# Patient Record
Sex: Male | Born: 1965 | Race: White | Hispanic: No | Marital: Married | Smoking: Current every day smoker
Health system: Southern US, Community
[De-identification: ages and names within clinical notes are randomized; demographics above are authoritative.]

## PROBLEM LIST (undated history)

## (undated) DIAGNOSIS — F1121 Opioid dependence, in remission: Secondary | ICD-10-CM

## (undated) DIAGNOSIS — F329 Major depressive disorder, single episode, unspecified: Secondary | ICD-10-CM

## (undated) DIAGNOSIS — F32A Depression, unspecified: Secondary | ICD-10-CM

## (undated) DIAGNOSIS — I10 Essential (primary) hypertension: Secondary | ICD-10-CM

## (undated) HISTORY — PX: HERNIA REPAIR: SHX51

## (undated) HISTORY — PX: OTHER SURGICAL HISTORY: SHX169

## (undated) HISTORY — PX: CHOLECYSTECTOMY: SHX55

---

## 2008-11-26 ENCOUNTER — Emergency Department (HOSPITAL_COMMUNITY): Admission: EM | Admit: 2008-11-26 | Discharge: 2008-11-26 | Payer: Self-pay | Admitting: Emergency Medicine

## 2009-03-10 ENCOUNTER — Encounter: Admission: RE | Admit: 2009-03-10 | Discharge: 2009-03-10 | Payer: Self-pay | Admitting: Internal Medicine

## 2009-05-11 ENCOUNTER — Emergency Department (HOSPITAL_COMMUNITY): Admission: EM | Admit: 2009-05-11 | Discharge: 2009-05-11 | Payer: Self-pay | Admitting: Emergency Medicine

## 2009-05-17 ENCOUNTER — Emergency Department (HOSPITAL_COMMUNITY): Admission: EM | Admit: 2009-05-17 | Discharge: 2009-05-17 | Payer: Self-pay | Admitting: Emergency Medicine

## 2009-07-25 ENCOUNTER — Ambulatory Visit (HOSPITAL_COMMUNITY): Admission: RE | Admit: 2009-07-25 | Discharge: 2009-07-25 | Payer: Self-pay | Admitting: Family Medicine

## 2009-12-03 ENCOUNTER — Inpatient Hospital Stay (HOSPITAL_COMMUNITY): Admission: EM | Admit: 2009-12-03 | Discharge: 2009-12-06 | Payer: Self-pay | Admitting: Emergency Medicine

## 2010-05-21 ENCOUNTER — Encounter: Admission: RE | Admit: 2010-05-21 | Discharge: 2010-05-21 | Payer: Self-pay | Admitting: Internal Medicine

## 2010-05-22 ENCOUNTER — Encounter: Admission: RE | Admit: 2010-05-22 | Discharge: 2010-05-22 | Payer: Self-pay | Admitting: Internal Medicine

## 2010-10-31 ENCOUNTER — Encounter: Payer: Self-pay | Admitting: Internal Medicine

## 2010-12-29 LAB — CBC
HCT: 44.3 % (ref 39.0–52.0)
HCT: 46.5 % (ref 39.0–52.0)
HCT: 46.8 % (ref 39.0–52.0)
Hemoglobin: 15.9 g/dL (ref 13.0–17.0)
Hemoglobin: 16.4 g/dL (ref 13.0–17.0)
MCHC: 35.1 g/dL (ref 30.0–36.0)
MCHC: 35.2 g/dL (ref 30.0–36.0)
MCV: 92.9 fL (ref 78.0–100.0)
Platelets: 141 10*3/uL — ABNORMAL LOW (ref 150–400)
Platelets: 88 10*3/uL — ABNORMAL LOW (ref 150–400)
RDW: 12.6 % (ref 11.5–15.5)
RDW: 13.2 % (ref 11.5–15.5)
RDW: 13.3 % (ref 11.5–15.5)
WBC: 12.1 10*3/uL — ABNORMAL HIGH (ref 4.0–10.5)
WBC: 5.1 10*3/uL (ref 4.0–10.5)

## 2010-12-29 LAB — URINE MICROSCOPIC-ADD ON

## 2010-12-29 LAB — URINALYSIS, ROUTINE W REFLEX MICROSCOPIC
Bilirubin Urine: NEGATIVE
Urobilinogen, UA: 0.2 mg/dL (ref 0.0–1.0)

## 2010-12-29 LAB — DIFFERENTIAL
Monocytes Absolute: 0.4 10*3/uL (ref 0.1–1.0)
Neutro Abs: 11.2 10*3/uL — ABNORMAL HIGH (ref 1.7–7.7)
Neutrophils Relative %: 92 % — ABNORMAL HIGH (ref 43–77)

## 2010-12-29 LAB — URINE CULTURE

## 2010-12-29 LAB — BASIC METABOLIC PANEL
CO2: 26 mEq/L (ref 19–32)
Chloride: 103 mEq/L (ref 96–112)
GFR calc non Af Amer: >60 mL/min (ref >60)
Potassium: 3.9 mEq/L (ref 3.5–5.1)

## 2010-12-29 LAB — CULTURE, BLOOD (ROUTINE X 2)

## 2011-01-16 LAB — URINALYSIS, ROUTINE W REFLEX MICROSCOPIC
Bilirubin Urine: NEGATIVE
Glucose, UA: NEGATIVE mg/dL
Ketones, ur: NEGATIVE mg/dL
Leukocytes, UA: NEGATIVE
Protein, ur: NEGATIVE mg/dL
Specific Gravity, Urine: <1.005 — ABNORMAL LOW (ref 1.005–1.030)
Urobilinogen, UA: 0.2 mg/dL (ref 0.0–1.0)
pH: 6 (ref 5.0–8.0)

## 2011-01-16 LAB — URINE MICROSCOPIC-ADD ON

## 2011-01-25 LAB — DIFFERENTIAL
Eosinophils Relative: 5 % (ref 0–5)
Lymphocytes Relative: 36 % (ref 12–46)
Monocytes Absolute: 0.6 10*3/uL (ref 0.1–1.0)
Monocytes Relative: 7 % (ref 3–12)
Neutrophils Relative %: 51 % (ref 43–77)

## 2011-01-25 LAB — POCT CARDIAC MARKERS
CKMB, poc: <1 ng/mL — ABNORMAL LOW (ref 1.0–8.0)
CKMB, poc: <1 ng/mL — ABNORMAL LOW (ref 1.0–8.0)
Myoglobin, poc: 80.2 ng/mL (ref 12–200)
Troponin i, poc: <0.05 ng/mL (ref 0.00–0.09)

## 2011-01-25 LAB — POCT I-STAT, CHEM 8
Calcium, Ion: 1.22 mmol/L (ref 1.12–1.32)
Creatinine, Ser: 0.9 mg/dL (ref 0.4–1.5)
Potassium: 4.9 mEq/L (ref 3.5–5.1)

## 2011-01-25 LAB — CBC
MCHC: 34.8 g/dL (ref 30.0–36.0)
Platelets: 223 10*3/uL (ref 150–400)

## 2015-02-09 ENCOUNTER — Other Ambulatory Visit (HOSPITAL_COMMUNITY): Payer: Self-pay

## 2015-02-09 ENCOUNTER — Observation Stay (HOSPITAL_COMMUNITY)
Admission: EM | Admit: 2015-02-09 | Discharge: 2015-02-10 | Disposition: A | Discharge status: Home / Self Care | Payer: BLUE CROSS/BLUE SHIELD | Attending: Emergency Medicine | Admitting: Emergency Medicine

## 2015-02-09 ENCOUNTER — Encounter (HOSPITAL_COMMUNITY): Payer: Self-pay | Admitting: *Deleted

## 2015-02-09 ENCOUNTER — Emergency Department (HOSPITAL_COMMUNITY): Payer: BLUE CROSS/BLUE SHIELD

## 2015-02-09 DIAGNOSIS — Z720 Tobacco use: Secondary | ICD-10-CM | POA: Insufficient documentation

## 2015-02-09 DIAGNOSIS — R0602 Shortness of breath: Secondary | ICD-10-CM | POA: Insufficient documentation

## 2015-02-09 DIAGNOSIS — R079 Chest pain, unspecified: Principal | ICD-10-CM | POA: Insufficient documentation

## 2015-02-09 DIAGNOSIS — I10 Essential (primary) hypertension: Secondary | ICD-10-CM | POA: Diagnosis not present

## 2015-02-09 DIAGNOSIS — R61 Generalized hyperhidrosis: Secondary | ICD-10-CM | POA: Diagnosis not present

## 2015-02-09 DIAGNOSIS — E871 Hypo-osmolality and hyponatremia: Secondary | ICD-10-CM | POA: Insufficient documentation

## 2015-02-09 DIAGNOSIS — F1121 Opioid dependence, in remission: Secondary | ICD-10-CM | POA: Diagnosis not present

## 2015-02-09 HISTORY — DX: Opioid dependence, in remission: F11.21

## 2015-02-09 LAB — CBC WITH DIFFERENTIAL/PLATELET
BASOS ABS: 0.1 10*3/uL (ref 0.0–0.1)
Basophils Relative: 1 % (ref 0–1)
EOS ABS: 0.2 10*3/uL (ref 0.0–0.7)
Eosinophils Relative: 3 % (ref 0–5)
HEMATOCRIT: 42.7 % (ref 39.0–52.0)
HEMOGLOBIN: 15.3 g/dL (ref 13.0–17.0)
LYMPHS PCT: 33 % (ref 12–46)
Lymphs Abs: 2.5 10*3/uL (ref 0.7–4.0)
MCH: 32.3 pg (ref 26.0–34.0)
MCHC: 35.8 g/dL (ref 30.0–36.0)
MCV: 90.3 fL (ref 78.0–100.0)
Monocytes Absolute: 0.6 10*3/uL (ref 0.1–1.0)
Monocytes Relative: 8 % (ref 3–12)
Neutro Abs: 4.2 10*3/uL (ref 1.7–7.7)
Neutrophils Relative %: 55 % (ref 43–77)
Platelets: 273 10*3/uL (ref 150–400)
RBC: 4.73 MIL/uL (ref 4.22–5.81)
RDW: 12.6 % (ref 11.5–15.5)
WBC: 7.6 10*3/uL (ref 4.0–10.5)

## 2015-02-09 LAB — BASIC METABOLIC PANEL
Anion gap: 9 (ref 5–15)
BUN: 14 mg/dL (ref 6–20)
CHLORIDE: 94 mmol/L — AB (ref 101–111)
CO2: 25 mmol/L (ref 22–32)
Calcium: 8.6 mg/dL — ABNORMAL LOW (ref 8.9–10.3)
Creatinine, Ser: 0.84 mg/dL (ref 0.61–1.24)
GFR calc Af Amer: >60 mL/min (ref >60)
Glucose, Bld: 100 mg/dL — ABNORMAL HIGH (ref 70–99)
POTASSIUM: 3.5 mmol/L (ref 3.5–5.1)
SODIUM: 128 mmol/L — AB (ref 135–145)

## 2015-02-09 LAB — TROPONIN I

## 2015-02-09 LAB — MAGNESIUM: MAGNESIUM: 2.1 mg/dL (ref 1.7–2.4)

## 2015-02-09 MED ORDER — ACETAMINOPHEN 325 MG PO TABS: 650.0000 mg | ORAL_TABLET | ORAL | Status: DC | PRN

## 2015-02-09 MED ORDER — HYDROCHLOROTHIAZIDE 25 MG PO TABS
25.0000 mg | ORAL_TABLET | Freq: Every day | ORAL | Status: DC
  Administered 2015-02-10: 25 mg via ORAL
  Filled 2015-02-09: qty 1

## 2015-02-09 MED ORDER — BUPRENORPHINE HCL 2 MG SL SUBL
8.0000 mg | SUBLINGUAL_TABLET | Freq: Three times a day (TID) | SUBLINGUAL | Status: DC
  Administered 2015-02-10: 8 mg via SUBLINGUAL
  Filled 2015-02-09 (×2): qty 4

## 2015-02-09 MED ORDER — GABAPENTIN 300 MG PO CAPS
300.0000 mg | ORAL_CAPSULE | Freq: Four times a day (QID) | ORAL | Status: DC
  Administered 2015-02-10 (×2): 300 mg via ORAL
  Filled 2015-02-09 (×3): qty 1

## 2015-02-09 MED ORDER — OLANZAPINE-FLUOXETINE HCL 6-25 MG PO CAPS
2.0000 | ORAL_CAPSULE | Freq: Every day | ORAL | Status: DC
  Administered 2015-02-09: 2 via ORAL
  Filled 2015-02-09 (×2): qty 2

## 2015-02-09 MED ORDER — ASPIRIN 81 MG PO CHEW
324.0000 mg | CHEWABLE_TABLET | Freq: Once | ORAL | Status: AC
  Administered 2015-02-09: 324 mg via ORAL
  Filled 2015-02-09: qty 4

## 2015-02-09 MED ORDER — MORPHINE SULFATE 4 MG/ML IJ SOLN
4.0000 mg | INTRAMUSCULAR | Status: DC | PRN
  Filled 2015-02-09: qty 1

## 2015-02-09 MED ORDER — LISINOPRIL 10 MG PO TABS
20.0000 mg | ORAL_TABLET | Freq: Every day | ORAL | Status: DC
  Administered 2015-02-10: 20 mg via ORAL
  Filled 2015-02-09 (×3): qty 2

## 2015-02-09 MED ORDER — LEVOFLOXACIN 500 MG PO TABS: 500.0000 mg | ORAL_TABLET | Freq: Every day | ORAL | Status: DC

## 2015-02-09 MED ORDER — ATORVASTATIN CALCIUM 40 MG PO TABS
40.0000 mg | ORAL_TABLET | Freq: Every day | ORAL | Status: DC
  Filled 2015-02-09: qty 1

## 2015-02-09 MED ORDER — ARMODAFINIL 250 MG PO TABS
250.0000 mg | ORAL_TABLET | Freq: Every day | ORAL | Status: DC
  Filled 2015-02-09 (×2): qty 1

## 2015-02-09 MED ORDER — HEPARIN SODIUM (PORCINE) 5000 UNIT/ML IJ SOLN
5000.0000 [IU] | Freq: Three times a day (TID) | INTRAMUSCULAR | Status: DC
  Administered 2015-02-09 – 2015-02-10 (×2): 5000 [IU] via SUBCUTANEOUS
  Filled 2015-02-09 (×2): qty 1

## 2015-02-09 MED ORDER — DEXTROAMPHETAMINE SULFATE 5 MG PO TABS: 5.0000 mg | ORAL_TABLET | Freq: Three times a day (TID) | ORAL | Status: DC

## 2015-02-09 MED ORDER — NITROGLYCERIN 0.4 MG SL SUBL
0.4000 mg | SUBLINGUAL_TABLET | SUBLINGUAL | Status: AC | PRN
  Administered 2015-02-09 (×3): 0.4 mg via SUBLINGUAL
  Filled 2015-02-09: qty 1

## 2015-02-09 MED ORDER — ONDANSETRON HCL 4 MG/2ML IJ SOLN: 4.0000 mg | Freq: Four times a day (QID) | INTRAMUSCULAR | Status: DC | PRN

## 2015-02-09 MED ORDER — HYDROMORPHONE HCL 1 MG/ML IJ SOLN
1.0000 mg | INTRAMUSCULAR | Status: DC | PRN
Start: 2015-02-09 — End: 2015-02-10

## 2015-02-09 MED ORDER — SODIUM CHLORIDE 0.9 % IV SOLN
INTRAVENOUS | Status: AC
  Administered 2015-02-09: 22:00:00 via INTRAVENOUS

## 2015-02-09 MED ORDER — ONDANSETRON HCL 4 MG/2ML IJ SOLN: 4.0000 mg | Freq: Three times a day (TID) | INTRAMUSCULAR | Status: DC | PRN

## 2015-02-09 MED ORDER — LISINOPRIL 10 MG PO TABS: 20.0000 mg | ORAL_TABLET | Freq: Every day | ORAL | Status: DC

## 2015-02-09 NOTE — H&P (Signed)
[facility] History and Physical  Wesley Giles WUJ:[id number] DOB: 08/14/1966 DOA: 02/09/2015  Referring physician: ER, Dr. Clarene Duke PCP: No primary care provider on file.   Chief Complaint: Chest pain  HPI: Wesley Giles is a 49 y.o. male  This is a 49 year old man, who has a history of narcotic addiction and is on Suboxone, presents with chest pain which started at noon today and has been intermittent. He describes it as lasting for over an hour at a time associated with sweating and dyspnea. It radiates to the left shoulder. The patient is a smoker. He has hypertension. He does not have a family history of early coronary artery disease. He is now being admitted for further investigation.   Review of Systems:  Apart from symptoms mentioned above, all systems negative.   Past Medical History  Diagnosis Date  . History of narcotic addiction 02/09/2015   Past Surgical History  Procedure Laterality Date  . Hernia repair    . Cholecystectomy    . Depression     Social History:  reports that he has been smoking.  He does not have any smokeless tobacco history on file. He reports that he does not drink alcohol or use illicit drugs.  Allergies  Allergen Reactions  . Sulfa Antibiotics Anaphylaxis     Family history: Father has diabetes.   Prior to Admission medications   Medication Sig Start Date End Date Taking? Authorizing Provider  aspirin-acetaminophen-caffeine (EXCEDRIN MIGRAINE) [phone] MG per tablet Take 1-2 tablets by mouth every 6 (six) hours as needed for headache or migraine.   Yes Historical Provider, MD  atorvastatin (LIPITOR) 40 MG tablet Take 40 mg by mouth daily. 01/29/15 01/29/16 Yes Historical Provider, MD  dextroamphetamine (DEXTROSTAT) 5 MG tablet Take 5 mg by mouth 3 (three) times daily. 12/29/14  Yes Historical Provider, MD  gabapentin (NEURONTIN) 300 MG capsule Take 300 mg by mouth 4 (four) times daily.   Yes Historical Provider, MD    hydrochlorothiazide (HYDRODIURIL) 25 MG tablet Take 25 mg by mouth daily. 01/26/15  Yes Historical Provider, MD  lisinopril (PRINIVIL,ZESTRIL) 20 MG tablet Take 20 mg by mouth daily. 01/01/15  Yes Historical Provider, MD  NUVIGIL 250 MG tablet Take 250 mg by mouth daily. 01/01/15  Yes Historical Provider, MD  OLANZapine-FLUoxetine (SYMBYAX) 6-25 MG per capsule Take 2 capsules by mouth at bedtime. 12/29/14  Yes Historical Provider, MD  SUBOXONE 8-2 MG FILM Place 1 Film under the tongue 3 (three) times daily. 01/29/15  Yes Historical Provider, MD  levofloxacin (LEVAQUIN) 500 MG tablet Take 500 mg by mouth daily. 10 dya course starting on 01/28/2015 01/28/15   Historical Provider, MD   Physical Exam: Filed Vitals:   02/09/15 1930 02/09/15 2000 02/09/15 2030 02/09/15 2100  BP: 110/83 126/95 106/82 110/66  Pulse: 78 73 70 74  Temp:      TempSrc:      Resp: Height:      Weight:      SpO2: 95% 99% 96% 95%    Wt Readings from Last 3 Encounters:  02/09/15 81.647 kg (180 lb)    General:  Appears calm and comfortable. He does not appear to be in pain at the present time. Eyes: PERRL, normal lids, irises & conjunctiva ENT: grossly normal hearing, lips & tongue Neck: no LAD, masses or thyromegaly Cardiovascular: RRR, no m/r/g. No LE edema. Telemetry: SR, no arrhythmias  Respiratory: CTA bilaterally, no w/r/r. Normal respiratory effort. Abdomen: soft, ntnd  Skin: no rash or induration seen on limited exam Musculoskeletal: grossly normal tone BUE/BLE Psychiatric: grossly normal mood and affect, speech fluent and appropriate Neurologic: grossly non-focal.          Labs on Admission:  Basic Metabolic Panel:  Recent Labs Lab 02/09/15 1930  NA 128*  K 3.5  CL 94*  CO2 25  GLUCOSE 100*  BUN 14  CREATININE 0.84  CALCIUM 8.6*  MG 2.1   Liver Function Tests: No results for input(s): AST, ALT, ALKPHOS, BILITOT, PROT, ALBUMIN in the last 168 hours. No results for input(s):  LIPASE, AMYLASE in the last 168 hours. No results for input(s): AMMONIA in the last 168 hours. CBC:  Recent Labs Lab 02/09/15 1930  WBC 7.6  NEUTROABS 4.2  HGB 15.3  HCT 42.7  MCV 90.3  PLT 273   Cardiac Enzymes:  Recent Labs Lab 02/09/15 1930  TROPONINI <0.03    BNP (last 3 results) No results for input(s): BNP in the last 8760 hours.  ProBNP (last 3 results) No results for input(s): PROBNP in the last 8760 hours.  CBG: No results for input(s): GLUCAP in the last 168 hours.  Radiological Exams on Admission: Dg Chest Portable 1 View  02/09/2015   CLINICAL DATA:  49 year old male with sharp central chest pain since noon today.  EXAM: PORTABLE CHEST - 1 VIEW  COMPARISON:  Chest x-ray 07/19/2014.  FINDINGS: Emphysematous changes are noted in the lungs bilaterally. No acute consolidative airspace disease. No pleural effusions. No evidence of pulmonary edema. No definite suspicious appearing pulmonary nodules or masses. Heart size is normal. Upper mediastinal contours are within normal limits.  IMPRESSION: 1. No radiographic evidence of acute cardiopulmonary disease. 2. Emphysema.   Electronically Signed   By: Trudie Reedaniel  Entrikin M.D.   On: 02/09/2015 19:36    EKG: Independently reviewed. Normal sinus rhythm without any acute ST-T wave changes.  Assessment/Plan   1. Chest pain. Although the description he gives for the chest pain is almost classical for cardiac pain, there are no electrocardiogram changes and troponin levels are negative. We will cycle troponins levels. We will request cardiology consultation. He may need a stress test. 2. Hypertension. Continue with antihypertensive medications. 3. History of narcotic addiction. Continue with Suboxone.  Further recommendations will depend on patient's hospital progress.   Code Status: Full code.  DVT Prophylaxis: Heparin.  Family Communication: I discussed the plan with the patient at the bedside.   Disposition Plan: Home  when medically stable.  Time spent: 45 minutes.  Wesley Giles [facility] Pager [phone].

## 2015-02-09 NOTE — ED Provider Notes (Signed)
CSN: [id number]     Arrival date & time 02/09/15  1846 History   First MD Initiated Contact with Patient 02/09/15 1854     Chief Complaint  Patient presents with  . Chest Pain      HPI Pt was seen at 1905. Per pt, c/o gradual onset and persistence of several intermittent episodes of chest "pain" since noon today. Pt describes the CP as "heaviness" and "tightness," located in his mid-sternal area with radiation into his left shoulder. Has been associated with SOB and diaphoresis. Pt's symptoms lasted for approximately 1 hour before resolving. Pt states his symptoms then "came back." Pt is unclear what makes his symptoms better or worse. Denies palpitations, no cough, no abd pain, no N/V/D, no back pain.    History reviewed. No pertinent past medical history.   Past Surgical History  Procedure Laterality Date  . Hernia repair    . Cholecystectomy    . Depression      History  Substance Use Topics  . Smoking status: Current Every Day Smoker  . Smokeless tobacco: Not on file  . Alcohol Use: No    Review of Systems ROS: Statement: All systems negative except as marked or noted in the HPI; Constitutional: Negative for fever and chills. ; ; Eyes: Negative for eye pain, redness and discharge. ; ; ENMT: Negative for ear pain, hoarseness, nasal congestion, sinus pressure and sore throat. ; ; Cardiovascular: +CP, SOB, diaphoresis. Negative for palpitations, and peripheral edema. ; ; Respiratory: Negative for cough, wheezing and stridor. ; ; Gastrointestinal: Negative for nausea, vomiting, diarrhea, abdominal pain, blood in stool, hematemesis, jaundice and rectal bleeding. . ; ; Genitourinary: Negative for dysuria, flank pain and hematuria. ; ; Musculoskeletal: Negative for back pain and neck pain. Negative for swelling and trauma.; ; Skin: Negative for pruritus, rash, abrasions, blisters, bruising and skin lesion.; ; Neuro: Negative for headache, lightheadedness and neck stiffness. Negative for  weakness, altered level of consciousness , altered mental status, extremity weakness, paresthesias, involuntary movement, seizure and syncope.      Allergies  Review of patient's allergies indicates not on file.  Home Medications   Prior to Admission medications   Not on File   BP 132/95 mmHg  Pulse 75  Temp(Src) 97.7 F (36.5 C) (Oral)  Resp 20  Ht  (1.88 m)  Wt 180 lb (81.647 kg)  BMI 23.10 kg/m2  SpO2 97% Physical Exam  1910: Physical examination:  Nursing notes reviewed; Vital signs and O2 SAT reviewed;  Constitutional: Well developed, Well nourished, Well hydrated, In no acute distress; Head:  Normocephalic, atraumatic; Eyes: EOMI, PERRL, No scleral icterus; ENMT: Mouth and pharynx normal, Mucous membranes moist; Neck: Supple, Full range of motion, No lymphadenopathy; Cardiovascular: Regular rate and rhythm, No gallop; Respiratory: Breath sounds clear & equal bilaterally, No wheezes.  Speaking full sentences with ease, Normal respiratory effort/excursion; Chest: Nontender, Movement normal; Abdomen: Soft, Nontender, Nondistended, Normal bowel sounds; Genitourinary: No CVA tenderness; Extremities: Pulses normal, No tenderness, No edema, No calf edema or asymmetry.; Neuro: AA&Ox3, Major CN grossly intact.  Speech clear. No gross focal motor or sensory deficits in extremities.; Skin: Color normal, Warm, Dry.   ED Course  Procedures     EKG Interpretation None      MDM  MDM Reviewed: previous chart, nursing note and vitals Reviewed previous: labs and ECG Interpretation: labs, ECG and x-ray Total time providing critical care: 30-74 minutes. This excludes time spent performing separately reportable procedures and services. Consults:  admitting MD   CRITICAL CARE Performed by: Laray AngerMCMANUS,Lizabeth Fellner M Total critical care time: 35 Critical care time was exclusive of separately billable procedures and treating other patients. Critical care was necessary to treat or prevent  imminent or life-threatening deterioration. Critical care was time spent personally by me on the following activities: development of treatment plan with patient and/or surrogate as well as nursing, discussions with consultants, evaluation of patient's response to treatment, examination of patient, obtaining history from patient or surrogate, ordering and performing treatments and interventions, ordering and review of laboratory studies, ordering and review of radiographic studies, pulse oximetry and re-evaluation of patient's condition.    ED ECG REPORT   Date: 02/09/2015  Rate: 78  Rhythm: normal sinus rhythm  QRS Axis: normal  Intervals: normal  ST/T Wave abnormalities: normal  Conduction Disutrbances:none  Narrative Interpretation:   Old EKG Reviewed: unchanged; no significant changes compared to previous EKG dated 11/26/2008.  I have personally reviewed the EKG tracing and agree with the computerized printout as noted.   Results for orders placed or performed during the hospital encounter of 02/09/15  CBC with Differential  Result Value Ref Range   WBC 7.6 4.0 - 10.5 K/uL   RBC 4.73 4.22 - 5.81 MIL/uL   Hemoglobin 15.3 13.0 - 17.0 g/dL   HCT 16.142.7 09.639.0 - 04.552.0 %   MCV 90.3 78.0 - 100.0 fL   MCH 32.3 26.0 - 34.0 pg   MCHC 35.8 30.0 - 36.0 g/dL   RDW 40.912.6 81.111.5 - 91.415.5 %   Platelets 273 150 - 400 K/uL   Neutrophils Relative % 55 43 - 77 %   Neutro Abs 4.2 1.7 - 7.7 K/uL   Lymphocytes Relative 33 12 - 46 %   Lymphs Abs 2.5 0.7 - 4.0 K/uL   Monocytes Relative 8 3 - 12 %   Monocytes Absolute 0.6 0.1 - 1.0 K/uL   Eosinophils Relative 3 0 - 5 %   Eosinophils Absolute 0.2 0.0 - 0.7 K/uL   Basophils Relative 1 0 - 1 %   Basophils Absolute 0.1 0.0 - 0.1 K/uL  Troponin I  Result Value Ref Range   Troponin I <0.03 <0.031 ng/mL  Basic metabolic panel  Result Value Ref Range   Sodium 128 (L) 135 - 145 mmol/L   Potassium 3.5 3.5 - 5.1 mmol/L   Chloride 94 (L) 101 - 111 mmol/L   CO2  25 22 - 32 mmol/L   Glucose, Bld 100 (H) 70 - 99 mg/dL   BUN 14 6 - 20 mg/dL   Creatinine, Ser 7.820.84 0.61 - 1.24 mg/dL   Calcium 8.6 (L) 8.9 - 10.3 mg/dL   GFR calc non Af Amer >60 >60 mL/min   GFR calc Af Amer >60 >60 mL/min   Anion gap 9 5 - 15   Dg Chest Portable 1 View 02/09/2015   CLINICAL DATA:  49 year old male with sharp central chest pain since noon today.  EXAM: PORTABLE CHEST - 1 VIEW  COMPARISON:  Chest x-ray 07/19/2014.  FINDINGS: Emphysematous changes are noted in the lungs bilaterally. No acute consolidative airspace disease. No pleural effusions. No evidence of pulmonary edema. No definite suspicious appearing pulmonary nodules or masses. Heart size is normal. Upper mediastinal contours are within normal limits.  IMPRESSION: 1. No radiographic evidence of acute cardiopulmonary disease. 2. Emphysema.   Electronically Signed   By: Trudie Reedaniel  Entrikin M.D.   On: 02/09/2015 19:36    2030:  New hyponatremia on today's labs; will  continue IVF. Pt's CP improving with ASA, SL ntg and IV morphine. Troponin normal and EKG unchanged from previous. Dx and testing d/w pt.  Questions answered.  Verb understanding, agreeable to admit.  T/C to [facility]. Karilyn Cota, case discussed, including:  HPI, pertinent PM/SHx, VS/PE, dx testing, ED course and treatment:  Agreeable to admit, requests to write temporary orders, obtain tele bed to team [facility].   Samuel Jester, DO 02/12/15 [phone]

## 2015-02-09 NOTE — ED Notes (Signed)
Chest pain with  Diaphoresis, onset today,

## 2015-02-10 DIAGNOSIS — R079 Chest pain, unspecified: Secondary | ICD-10-CM | POA: Diagnosis not present

## 2015-02-10 DIAGNOSIS — E871 Hypo-osmolality and hyponatremia: Secondary | ICD-10-CM | POA: Diagnosis present

## 2015-02-10 DIAGNOSIS — I10 Essential (primary) hypertension: Secondary | ICD-10-CM | POA: Diagnosis not present

## 2015-02-10 LAB — BASIC METABOLIC PANEL
Anion gap: 4 — ABNORMAL LOW (ref 5–15)
BUN: 12 mg/dL (ref 6–20)
CHLORIDE: 96 mmol/L — AB (ref 101–111)
CO2: 28 mmol/L (ref 22–32)
Calcium: 8.6 mg/dL — ABNORMAL LOW (ref 8.9–10.3)
Creatinine, Ser: 0.79 mg/dL (ref 0.61–1.24)
GFR calc Af Amer: >60 mL/min (ref >60)
Glucose, Bld: 88 mg/dL (ref 70–99)
POTASSIUM: 4.5 mmol/L (ref 3.5–5.1)
SODIUM: 128 mmol/L — AB (ref 135–145)

## 2015-02-10 LAB — TROPONIN I
Troponin I: <0.03 ng/mL (ref <0.031)
Troponin I: <0.03 ng/mL (ref <0.031)

## 2015-02-10 MED ORDER — HYDROCHLOROTHIAZIDE 25 MG PO TABS: 12.5000 mg | ORAL_TABLET | Freq: Every day | ORAL | Status: DC

## 2015-02-10 NOTE — Care Management Note (Signed)
Case Management Note  Patient Details  Name: Alain MarionMario P Reifsteck MRN: [id number] Date of Birth: June 07, 1966  Subjective/Objective:                  Pt admitted from home with CP. Pt lives with his wife and will return home at discharge. Pt is independent with ADL's.  Action/Plan: Pt for discharge today. No CM needs noted.  Expected Discharge Date:  02/10/15               Expected Discharge Plan:  Home/Self Care  In-House Referral:  NA  Discharge planning Services  CM Consult  Post Acute Care Choice:  NA Choice offered to:  NA  DME Arranged:    DME Agency:     HH Arranged:    HH Agency:     Status of Service:     Medicare Important Message Given:    Date Medicare IM Given:    Medicare IM give by:    Date Additional Medicare IM Given:    Additional Medicare Important Message give by:     If discussed at Long Length of Stay Meetings, dates discussed:    Additional Comments:  Cheryl FlashBlackwell, Jakoby Melendrez Crowder, RN 02/10/2015, 1:23 PM

## 2015-02-10 NOTE — Progress Notes (Signed)
Patient with orders to be discharge home. Discharge instructions given, patient verbalized understanding. Patient stable. Patient left in private vehicle.  

## 2015-02-10 NOTE — Discharge Summary (Signed)
Physician Discharge Summary  Wesley Giles MWU:[id number] DOB: 1966/02/10 DOA: 02/09/2015  PCP: No primary care provider on file.  Admit date: 02/09/2015 Discharge date: 02/10/2015  Time spent: 30 minutes  Recommendations for Outpatient Follow-up:  1.    Discharge Diagnoses:    Chest pain   HTN (hypertension)   History of narcotic addiction   Hyponatremia      Discharge Condition: improved  Diet recommendation: heart healthy  Filed Weights   02/09/15 1853  Weight: 81.647 kg (180 lb)    History of present illness:  Wesley Giles is a 49 y.o. male with a history of narcotic addiction and is on Suboxone and HTN who presented with chest pain. He described it as lasting for over an hour at a time associated with sweating and dyspnea The pain radiated to the left should. He does not have a family history of early coronary artery disease. He was admitted for further investigation and management.  Hospital Course:  On admission, the patient was afebrile and hemodynamically stable. His CXR revealed emphysema, but no acute findings. His EKG revealed NSR without any acute abnormalities. His lab data was only remarkable of a serum sodium of 128. He was started on ASA and his chronic medication were restarted. Morphine and sublingual nitro was ordered for pain. Gentle normal saline was given for hyponatremia. He was advised to stop smoking. Studies were ordered. His troponin I was negative x 4. His follow up EKG revealed NSR without any abnormalities. The patient reported no shortness of breath and no recent bilateral or unilateral leg swelling. He was oxygenating 95-97% on room air during the hospitalization. He reported having a negative stress test last year, performed by his cardiologist in [facility].  The patient had no complaints of chest pain during the hospital course. Etiology of his chest pain was unclear. He takes dextroamphetamine chronically which can cause chest pain and  palpitations, though it was unkown if it were the cause.Marland Kitchen He was informed of this and was advised to talk to his physician about the management of it. His serum sodium remained 128 after IV fluid hydration. The etiology was likely from HCTZ, so he was instructed to decrease the dose to 12.5 mg daily.  Procedures:  none  Consultations:  none  Discharge Exam: Filed Vitals:   02/10/15 0636  BP: 114/76  Pulse: 60  Temp: 97.8 F (36.6 C)  Resp: 20  02 sat 97 % room air  General: 49 yo man in NAD Cardiovascular: S1 S2 no M/R/G Respiratory: CTA bilaterally   Discharge Instructions   Discharge Instructions    Diet general    Complete by:  As directed      Discharge instructions    Complete by:  As directed   The dose of hydrochlorothiazide was decreased to half as it can cause a decrease in your blood sodium level. Your blood sodium level was still low at 128. Recommend that you follow-up with your primary care physician. Also of note, dextroamphetamine can cause chest pain and palpitations. This will need to be discussed further with your primary physicians.     Increase activity slowly    Complete by:  As directed           Current Discharge Medication List    CONTINUE these medications which have CHANGED   Details  hydrochlorothiazide (HYDRODIURIL) 25 MG tablet Take 0.5 tablets (12.5 mg total) by mouth daily.      CONTINUE these medications which have  NOT CHANGED   Details  aspirin-acetaminophen-caffeine (EXCEDRIN MIGRAINE) 250-250-65 MG per tablet Take 1-2 tablets by mouth every 6 (six) hours as needed for headache or migraine.    atorvastatin (LIPITOR) 40 MG tablet Take 40 mg by mouth daily.    dextroamphetamine (DEXTROSTAT) 5 MG tablet Take 5 mg by mouth 3 (three) times daily. Refills: 0    gabapentin (NEURONTIN) 300 MG capsule Take 300 mg by mouth 4 (four) times daily.    lisinopril (PRINIVIL,ZESTRIL) 20 MG tablet Take 20 mg by mouth daily. Refills: 5     NUVIGIL 250 MG tablet Take 250 mg by mouth daily. Refills: 3    OLANZapine-FLUoxetine (SYMBYAX) 6-25 MG per capsule Take 2 capsules by mouth at bedtime. Refills: 5    SUBOXONE 8-2 MG FILM Place 1 Film under the tongue 3 (three) times daily. Refills: 0      STOP taking these medications     levofloxacin (LEVAQUIN) 500 MG tablet        Allergies  Allergen Reactions  . Sulfa Antibiotics Anaphylaxis   Follow-up Information    Please follow up.   Why:  [place] your cardiologist and [facility] [place] [facility] in 1-2 weeks.       The results of significant diagnostics from this hospitalization (including imaging, microbiology, ancillary and laboratory) are listed below for reference.    Significant Diagnostic Studies: Dg Chest Portable 1 View  02/09/2015   CLINICAL DATA:  49 year old male with sharp central chest pain since noon today.  EXAM: PORTABLE CHEST - 1 VIEW  COMPARISON:  Chest x-ray 07/19/2014.  FINDINGS: Emphysematous changes are noted in the lungs bilaterally. No acute consolidative airspace disease. No pleural effusions. No evidence of pulmonary edema. No definite suspicious appearing pulmonary nodules or masses. Heart size is normal. Upper mediastinal contours are within normal limits.  IMPRESSION: 1. No radiographic evidence of acute cardiopulmonary disease. 2. Emphysema.   Electronically Signed   By: Trudie Reedaniel  Entrikin M.D.   On: 02/09/2015 19:36    Microbiology: No results found for this or any previous visit (from the past 240 hour(s)).   Labs: Basic Metabolic Panel:  Recent Labs Lab 02/09/15 1930 02/10/15 1011  NA 128* 128*  K 3.5 4.5  CL 94* 96*  CO2 25 28  GLUCOSE 100* 88  BUN 14 12  CREATININE 0.84 0.79  CALCIUM 8.6* 8.6*  MG 2.1  --    Liver Function Tests: No results for input(s): AST, ALT, ALKPHOS, BILITOT, PROT, ALBUMIN in the last 168 hours. No results for input(s): LIPASE, AMYLASE in the last 168 hours. No results for  input(s): AMMONIA in the last 168 hours. CBC:  Recent Labs Lab 02/09/15 1930  WBC 7.6  NEUTROABS 4.2  HGB 15.3  HCT 42.7  MCV 90.3  PLT 273   Cardiac Enzymes:  Recent Labs Lab 02/09/15 1930 02/09/15 2213 02/10/15 0039 02/10/15 0425  TROPONINI <0.03 <0.03 <0.03 <0.03   BNP: BNP (last 3 results) No results for input(s): BNP in the last 8760 hours.  ProBNP (last 3 results) No results for input(s): PROBNP in the last 8760 hours.  CBG: No results for input(s): GLUCAP in the last 168 hours.     Signed:  Madlyn Crosby  [facility] 02/10/2015, 12:52 PM

## 2015-10-03 IMAGING — CR DG CHEST 1V PORT
1 series · 1 of 1 positions shown · non-contrast
Comparison: Chest x-ray 07/19/2014.

CLINICAL DATA: 48-year-old male with sharp central chest pain since
noon today.

EXAM:
PORTABLE CHEST - 1 VIEW

[ap portable]
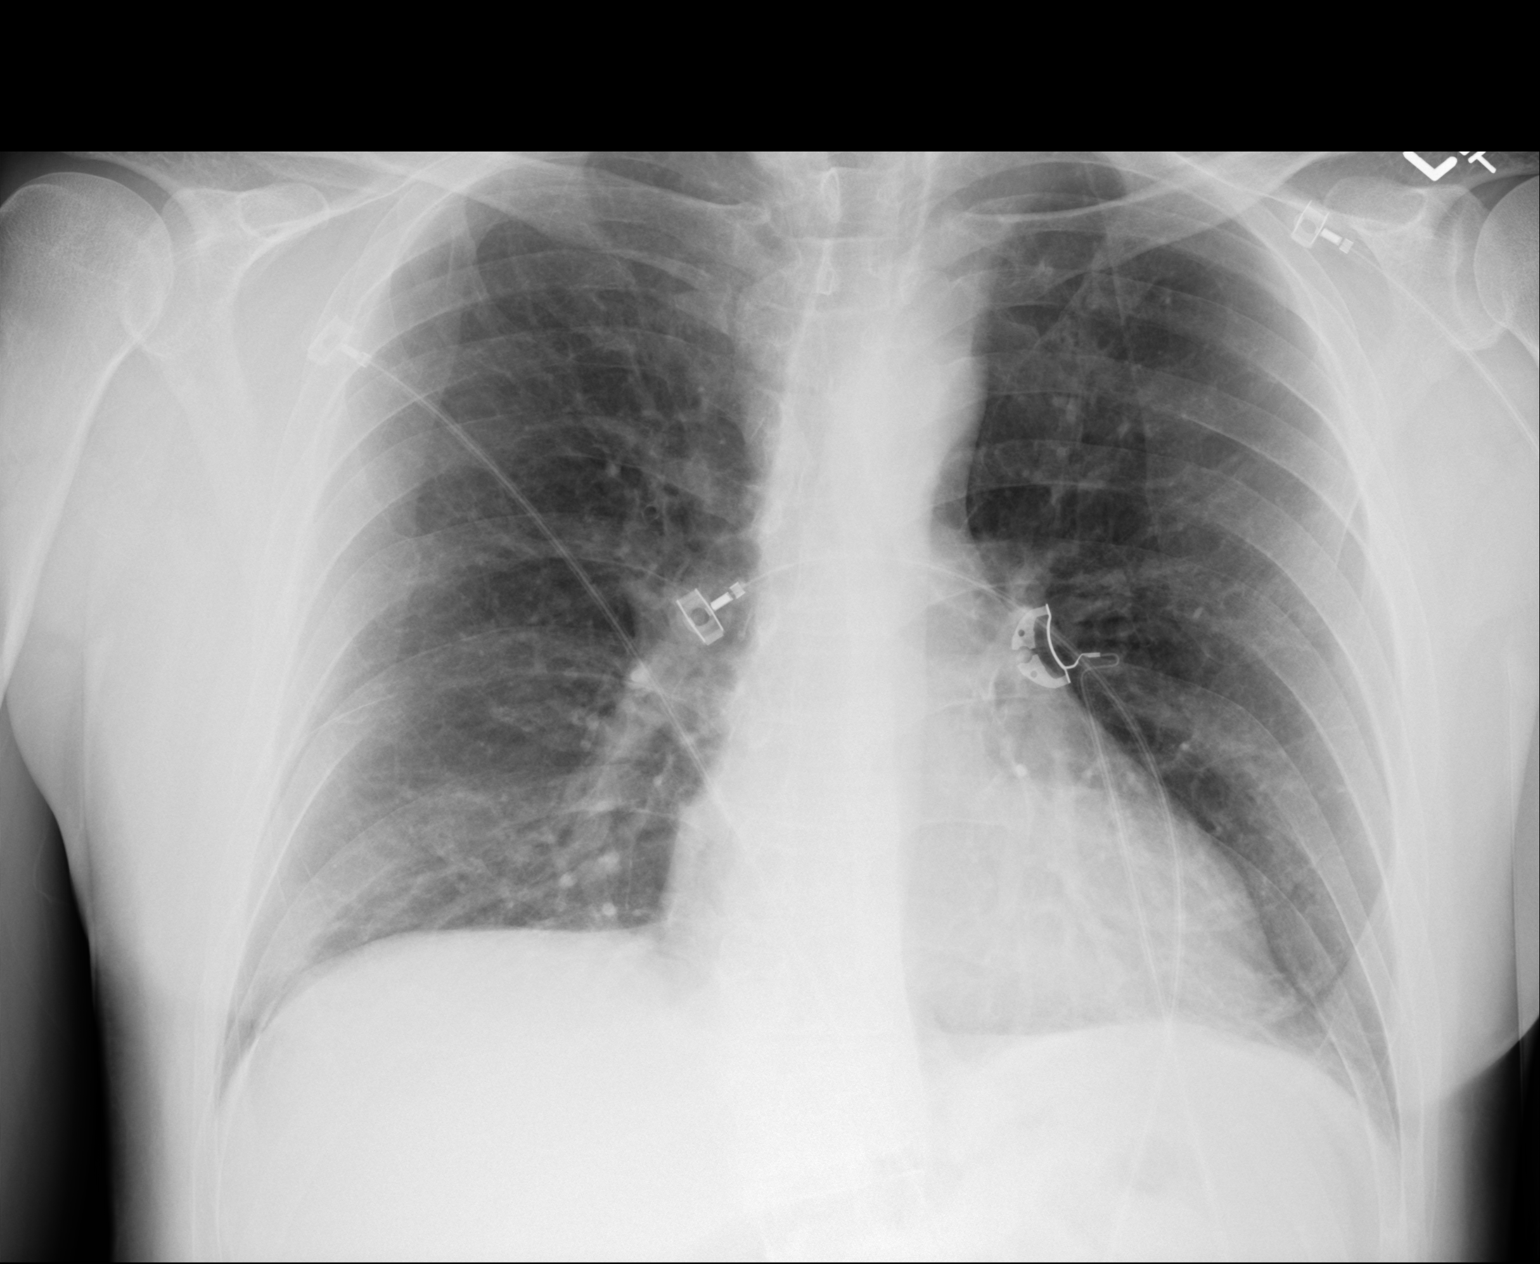

[1 of 1 positions shown; findings below may reference images not displayed]

FINDINGS: Emphysematous changes are noted in the lungs bilaterally. No acute
consolidative airspace disease. No pleural effusions. No evidence of
pulmonary edema. No definite suspicious appearing pulmonary nodules
or masses. Heart size is normal. Upper mediastinal contours are
within normal limits.
IMPRESSION: 1. No radiographic evidence of acute cardiopulmonary disease.
2. Emphysema.

## 2016-04-15 ENCOUNTER — Encounter (HOSPITAL_COMMUNITY): Payer: Self-pay

## 2016-04-15 ENCOUNTER — Encounter (HOSPITAL_COMMUNITY): Payer: Self-pay | Admitting: Emergency Medicine

## 2016-04-15 ENCOUNTER — Inpatient Hospital Stay (HOSPITAL_COMMUNITY)
Admission: AD | Admit: 2016-04-15 | Discharge: 2016-04-21 | DRG: 885 | Disposition: A | Discharge status: Home / Self Care | Payer: BLUE CROSS/BLUE SHIELD | Source: Intra-hospital | Attending: Psychiatry | Admitting: Psychiatry

## 2016-04-15 ENCOUNTER — Emergency Department (HOSPITAL_COMMUNITY)
Admission: EM | Admit: 2016-04-15 | Discharge: 2016-04-15 | Disposition: A | Discharge status: Other Acute Inpatient Hospital | Payer: BLUE CROSS/BLUE SHIELD | Attending: Emergency Medicine | Admitting: Emergency Medicine

## 2016-04-15 DIAGNOSIS — M549 Dorsalgia, unspecified: Secondary | ICD-10-CM | POA: Diagnosis present

## 2016-04-15 DIAGNOSIS — F332 Major depressive disorder, recurrent severe without psychotic features: Principal | ICD-10-CM | POA: Diagnosis present

## 2016-04-15 DIAGNOSIS — R45851 Suicidal ideations: Secondary | ICD-10-CM | POA: Diagnosis present

## 2016-04-15 DIAGNOSIS — G8929 Other chronic pain: Secondary | ICD-10-CM | POA: Diagnosis present

## 2016-04-15 DIAGNOSIS — F329 Major depressive disorder, single episode, unspecified: Secondary | ICD-10-CM | POA: Insufficient documentation

## 2016-04-15 DIAGNOSIS — K219 Gastro-esophageal reflux disease without esophagitis: Secondary | ICD-10-CM | POA: Diagnosis present

## 2016-04-15 DIAGNOSIS — G4700 Insomnia, unspecified: Secondary | ICD-10-CM | POA: Diagnosis present

## 2016-04-15 DIAGNOSIS — F411 Generalized anxiety disorder: Secondary | ICD-10-CM | POA: Diagnosis present

## 2016-04-15 DIAGNOSIS — J329 Chronic sinusitis, unspecified: Secondary | ICD-10-CM | POA: Diagnosis present

## 2016-04-15 DIAGNOSIS — I10 Essential (primary) hypertension: Secondary | ICD-10-CM | POA: Insufficient documentation

## 2016-04-15 DIAGNOSIS — Z7982 Long term (current) use of aspirin: Secondary | ICD-10-CM | POA: Insufficient documentation

## 2016-04-15 DIAGNOSIS — Z79899 Other long term (current) drug therapy: Secondary | ICD-10-CM | POA: Insufficient documentation

## 2016-04-15 DIAGNOSIS — T50902A Poisoning by unspecified drugs, medicaments and biological substances, intentional self-harm, initial encounter: Secondary | ICD-10-CM

## 2016-04-15 DIAGNOSIS — F1123 Opioid dependence with withdrawal: Secondary | ICD-10-CM | POA: Diagnosis present

## 2016-04-15 DIAGNOSIS — T50992A Poisoning by other drugs, medicaments and biological substances, intentional self-harm, initial encounter: Secondary | ICD-10-CM | POA: Insufficient documentation

## 2016-04-15 DIAGNOSIS — F17210 Nicotine dependence, cigarettes, uncomplicated: Secondary | ICD-10-CM | POA: Diagnosis present

## 2016-04-15 HISTORY — DX: Major depressive disorder, single episode, unspecified: F32.9

## 2016-04-15 HISTORY — DX: Depression, unspecified: F32.A

## 2016-04-15 HISTORY — DX: Essential (primary) hypertension: I10

## 2016-04-15 LAB — CBC WITH DIFFERENTIAL/PLATELET
BASOS ABS: 0.1 10*3/uL (ref 0.0–0.1)
BASOS PCT: 1 %
EOS ABS: 0.4 10*3/uL (ref 0.0–0.7)
Eosinophils Relative: 4 %
HEMATOCRIT: 48.6 % (ref 39.0–52.0)
HEMOGLOBIN: 17.3 g/dL — AB (ref 13.0–17.0)
Lymphocytes Relative: 23 %
Lymphs Abs: 2.4 10*3/uL (ref 0.7–4.0)
MCH: 32.2 pg (ref 26.0–34.0)
MCHC: 35.6 g/dL (ref 30.0–36.0)
MCV: 90.5 fL (ref 78.0–100.0)
MONOS PCT: 10 %
Monocytes Absolute: 1.1 10*3/uL — ABNORMAL HIGH (ref 0.1–1.0)
NEUTROS ABS: 6.5 10*3/uL (ref 1.7–7.7)
NEUTROS PCT: 62 %
Platelets: 347 10*3/uL (ref 150–400)
RBC: 5.37 MIL/uL (ref 4.22–5.81)
RDW: 13.3 % (ref 11.5–15.5)
WBC: 10.4 10*3/uL (ref 4.0–10.5)

## 2016-04-15 LAB — COMPREHENSIVE METABOLIC PANEL
ALT: 45 U/L (ref 17–63)
AST: 27 U/L (ref 15–41)
Albumin: 4.5 g/dL (ref 3.5–5.0)
Alkaline Phosphatase: 129 U/L — ABNORMAL HIGH (ref 38–126)
Anion gap: 8 (ref 5–15)
BUN: 18 mg/dL (ref 6–20)
CHLORIDE: 110 mmol/L (ref 101–111)
CO2: 20 mmol/L — AB (ref 22–32)
CREATININE: 1.02 mg/dL (ref 0.61–1.24)
Calcium: 9.4 mg/dL (ref 8.9–10.3)
Glucose, Bld: 106 mg/dL — ABNORMAL HIGH (ref 65–99)
POTASSIUM: 3.3 mmol/L — AB (ref 3.5–5.1)
SODIUM: 138 mmol/L (ref 135–145)
Total Bilirubin: 0.5 mg/dL (ref 0.3–1.2)
Total Protein: 8.1 g/dL (ref 6.5–8.1)

## 2016-04-15 LAB — SALICYLATE LEVEL

## 2016-04-15 LAB — RAPID URINE DRUG SCREEN, HOSP PERFORMED
AMPHETAMINES: NOT DETECTED
BARBITURATES: NOT DETECTED
BENZODIAZEPINES: NOT DETECTED
COCAINE: NOT DETECTED
OPIATES: NOT DETECTED
TETRAHYDROCANNABINOL: POSITIVE — AB

## 2016-04-15 LAB — ACETAMINOPHEN LEVEL

## 2016-04-15 LAB — ETHANOL

## 2016-04-15 MED ORDER — ACETAMINOPHEN 325 MG PO TABS
650.0000 mg | ORAL_TABLET | ORAL | Status: DC | PRN
  Administered 2016-04-15: 650 mg via ORAL
  Filled 2016-04-15: qty 2

## 2016-04-15 MED ORDER — MAGNESIUM HYDROXIDE 400 MG/5ML PO SUSP: 30.0000 mL | Freq: Every day | ORAL | Status: DC | PRN

## 2016-04-15 MED ORDER — ONDANSETRON 4 MG PO TBDP: 4.0000 mg | ORAL_TABLET | Freq: Four times a day (QID) | ORAL | Status: AC | PRN

## 2016-04-15 MED ORDER — NICOTINE 14 MG/24HR TD PT24: 14.0000 mg | MEDICATED_PATCH | Freq: Every day | TRANSDERMAL | Status: DC

## 2016-04-15 MED ORDER — METHOCARBAMOL 500 MG PO TABS
500.0000 mg | ORAL_TABLET | Freq: Three times a day (TID) | ORAL | Status: AC | PRN
  Administered 2016-04-16 – 2016-04-20 (×8): 500 mg via ORAL
  Filled 2016-04-15 (×8): qty 1

## 2016-04-15 MED ORDER — AMPHETAMINE-DEXTROAMPHETAMINE 10 MG PO TABS: 5.0000 mg | ORAL_TABLET | Freq: Three times a day (TID) | ORAL | Status: DC

## 2016-04-15 MED ORDER — LOPERAMIDE HCL 2 MG PO CAPS
2.0000 mg | ORAL_CAPSULE | ORAL | Status: AC | PRN
  Administered 2016-04-16: 4 mg via ORAL
  Administered 2016-04-16 – 2016-04-17 (×3): 2 mg via ORAL
  Filled 2016-04-15: qty 1
  Filled 2016-04-15: qty 2
  Filled 2016-04-15 (×2): qty 1

## 2016-04-15 MED ORDER — ZOLPIDEM TARTRATE 5 MG PO TABS: 5.0000 mg | ORAL_TABLET | Freq: Every evening | ORAL | Status: DC | PRN

## 2016-04-15 MED ORDER — HYDROCHLOROTHIAZIDE 25 MG PO TABS
12.5000 mg | ORAL_TABLET | Freq: Every day | ORAL | Status: DC
  Filled 2016-04-15 (×2): qty 0.5

## 2016-04-15 MED ORDER — LORAZEPAM 1 MG PO TABS: 1.0000 mg | ORAL_TABLET | Freq: Three times a day (TID) | ORAL | Status: DC | PRN

## 2016-04-15 MED ORDER — OLANZAPINE-FLUOXETINE HCL 6-25 MG PO CAPS
2.0000 | ORAL_CAPSULE | Freq: Every day | ORAL | Status: DC
  Filled 2016-04-15 (×3): qty 2

## 2016-04-15 MED ORDER — OLANZAPINE-FLUOXETINE HCL 6-25 MG PO CAPS
2.0000 | ORAL_CAPSULE | Freq: Every day | ORAL | Status: DC
  Filled 2016-04-15 (×2): qty 2

## 2016-04-15 MED ORDER — HYDROXYZINE HCL 25 MG PO TABS
25.0000 mg | ORAL_TABLET | Freq: Four times a day (QID) | ORAL | Status: DC | PRN
  Administered 2016-04-16 – 2016-04-18 (×6): 25 mg via ORAL
  Filled 2016-04-15 (×6): qty 1

## 2016-04-15 MED ORDER — DEXTROAMPHETAMINE SULFATE 5 MG PO TABS: 5.0000 mg | ORAL_TABLET | Freq: Three times a day (TID) | ORAL | Status: DC

## 2016-04-15 MED ORDER — CLONIDINE HCL 0.1 MG PO TABS
0.1000 mg | ORAL_TABLET | Freq: Every day | ORAL | Status: DC
  Filled 2016-04-15 (×2): qty 1

## 2016-04-15 MED ORDER — CLONIDINE HCL 0.1 MG PO TABS
0.1000 mg | ORAL_TABLET | ORAL | Status: AC
Start: 2016-04-18 — End: 2016-04-20
  Administered 2016-04-18 – 2016-04-20 (×4): 0.1 mg via ORAL
  Filled 2016-04-15 (×4): qty 1

## 2016-04-15 MED ORDER — CLONIDINE HCL 0.1 MG PO TABS
0.1000 mg | ORAL_TABLET | Freq: Four times a day (QID) | ORAL | Status: AC
  Administered 2016-04-16 – 2016-04-18 (×9): 0.1 mg via ORAL
  Filled 2016-04-15 (×11): qty 1

## 2016-04-15 MED ORDER — LISINOPRIL 10 MG PO TABS
20.0000 mg | ORAL_TABLET | Freq: Every day | ORAL | Status: DC
  Administered 2016-04-15: 20 mg via ORAL
  Filled 2016-04-15: qty 2

## 2016-04-15 MED ORDER — POTASSIUM CHLORIDE CRYS ER 20 MEQ PO TBCR
40.0000 meq | EXTENDED_RELEASE_TABLET | Freq: Once | ORAL | Status: AC
  Administered 2016-04-15: 40 meq via ORAL
  Filled 2016-04-15: qty 2

## 2016-04-15 MED ORDER — ALUM & MAG HYDROXIDE-SIMETH 200-200-20 MG/5ML PO SUSP
30.0000 mL | ORAL | Status: DC | PRN
  Administered 2016-04-16: 30 mL via ORAL
  Filled 2016-04-15: qty 30

## 2016-04-15 MED ORDER — LISINOPRIL 20 MG PO TABS
20.0000 mg | ORAL_TABLET | Freq: Every day | ORAL | Status: DC
  Administered 2016-04-16 – 2016-04-21 (×6): 20 mg via ORAL
  Filled 2016-04-15 (×9): qty 1

## 2016-04-15 MED ORDER — IBUPROFEN 400 MG PO TABS: 600.0000 mg | ORAL_TABLET | Freq: Three times a day (TID) | ORAL | Status: DC | PRN

## 2016-04-15 MED ORDER — DICYCLOMINE HCL 20 MG PO TABS
20.0000 mg | ORAL_TABLET | Freq: Four times a day (QID) | ORAL | Status: AC | PRN
  Administered 2016-04-16 – 2016-04-17 (×3): 20 mg via ORAL
  Filled 2016-04-15 (×3): qty 1

## 2016-04-15 MED ORDER — BUPRENORPHINE HCL 2 MG SL SUBL
2.0000 mg | SUBLINGUAL_TABLET | Freq: Every day | SUBLINGUAL | Status: DC
  Administered 2016-04-15: 2 mg via SUBLINGUAL
  Filled 2016-04-15: qty 1

## 2016-04-15 MED ORDER — NICOTINE 21 MG/24HR TD PT24
21.0000 mg | MEDICATED_PATCH | Freq: Every day | TRANSDERMAL | Status: DC
  Administered 2016-04-16 – 2016-04-21 (×6): 21 mg via TRANSDERMAL
  Filled 2016-04-15 (×8): qty 1

## 2016-04-15 MED ORDER — ONDANSETRON HCL 4 MG PO TABS: 4.0000 mg | ORAL_TABLET | Freq: Three times a day (TID) | ORAL | Status: DC | PRN

## 2016-04-15 MED ORDER — GABAPENTIN 300 MG PO CAPS
300.0000 mg | ORAL_CAPSULE | Freq: Four times a day (QID) | ORAL | Status: DC
  Administered 2016-04-15 (×2): 300 mg via ORAL
  Filled 2016-04-15 (×2): qty 1

## 2016-04-15 MED ORDER — NAPROXEN 500 MG PO TABS
500.0000 mg | ORAL_TABLET | Freq: Two times a day (BID) | ORAL | Status: AC | PRN
  Administered 2016-04-16 – 2016-04-20 (×9): 500 mg via ORAL
  Filled 2016-04-15 (×9): qty 1

## 2016-04-15 MED ORDER — ALUM & MAG HYDROXIDE-SIMETH 200-200-20 MG/5ML PO SUSP: 30.0000 mL | ORAL | Status: DC | PRN

## 2016-04-15 MED ORDER — ACETAMINOPHEN 325 MG PO TABS
650.0000 mg | ORAL_TABLET | Freq: Four times a day (QID) | ORAL | Status: DC | PRN
  Administered 2016-04-16 – 2016-04-21 (×4): 650 mg via ORAL
  Filled 2016-04-15 (×4): qty 2

## 2016-04-15 MED ORDER — PNEUMOCOCCAL VAC POLYVALENT 25 MCG/0.5ML IJ INJ
0.5000 mL | INJECTION | INTRAMUSCULAR | Status: AC
  Administered 2016-04-16: 0.5 mL via INTRAMUSCULAR

## 2016-04-15 NOTE — ED Notes (Signed)
Pt reports SI, states he took unknown amount of amlodipine and lisinopril yesterday at 1200 to OD. States he slept for most of the day after, did not seek medical care. Denies any ingestion today. Denies HI.

## 2016-04-15 NOTE — ED Notes (Signed)
Pt ambulatory to restroom at this time. 

## 2016-04-15 NOTE — ED Notes (Signed)
[facility], vital signs stable no distress noted.  Report and paperwork completed before start of 7pm shift.

## 2016-04-15 NOTE — ED Notes (Signed)
PT placed in purple scrubs and security has wanded pt.
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
## 2016-04-15 NOTE — ED Provider Notes (Signed)
CSN: [id number]     Arrival date & time 04/15/16  0410 History   First MD Initiated Contact with Patient 04/15/16 0421     Chief Complaint  Patient presents with  . V70.1     (Consider location/radiation/quality/duration/timing/severity/associated sxs/prior Treatment) The history is provided by the patient.  50 year old male with a history of depression, narcotic abuse, and hypertension comes in approximately 2 days after taking an overdose of a lot of pain and lisinopril with suicidal intent. He states that he took approximately 10 tablets of amlodipine 10 mg and up out 10 tablets of lisinopril 20 mg at about noon on July 5. He thinks he maybe also taken to Prozac tablets. He denies taking any over-the-counter medications. He continues to have suicidal ideation. He was hospitalized at [facility] 2 weeks ago with depression. He had been feeling better, but states that he has no support at home and started feeling worse. He does have history of depression but no prior suicide attempts. He denies hallucinations. He denies current narcotic use. He does admit to crying spells, early morning wakening, and anhedonia.  Past Medical History  Diagnosis Date  . History of narcotic addiction (HCC) 02/09/2015  . Depression   . Hypertension    Past Surgical History  Procedure Laterality Date  . Hernia repair    . Cholecystectomy    . Depression     History reviewed. No pertinent family history. Social History  Substance Use Topics  . Smoking status: Current Every Day Smoker -- 1.00 packs/day    Types: Cigarettes  . Smokeless tobacco: None  . Alcohol Use: No    Review of Systems  All other systems reviewed and are negative.     Allergies  Sulfa antibiotics  Home Medications   Prior to Admission medications   Medication Sig Start Date End Date Taking? Authorizing Provider  aspirin-acetaminophen-caffeine (EXCEDRIN MIGRAINE) [id number] MG per tablet Take 1-2 tablets by mouth  every 6 (six) hours as needed for headache or migraine.   Yes Historical Provider, MD  gabapentin (NEURONTIN) 300 MG capsule Take 300 mg by mouth 4 (four) times daily.   Yes Historical Provider, MD  lisinopril (PRINIVIL,ZESTRIL) 20 MG tablet Take 20 mg by mouth daily. 01/01/15  Yes Historical Provider, MD  OLANZapine-FLUoxetine (SYMBYAX) 6-25 MG per capsule Take 2 capsules by mouth at bedtime. 12/29/14  Yes Historical Provider, MD  SUBOXONE 8-2 MG FILM Place 1 Film under the tongue 3 (three) times daily. 01/29/15  Yes Historical Provider, MD  atorvastatin (LIPITOR) 40 MG tablet Take 40 mg by mouth daily. 01/29/15 01/29/16  Historical Provider, MD  dextroamphetamine (DEXTROSTAT) 5 MG tablet Take 5 mg by mouth 3 (three) times daily. 12/29/14   Historical Provider, MD  hydrochlorothiazide (HYDRODIURIL) 25 MG tablet Take 0.5 tablets (12.5 mg total) by mouth daily. 02/10/15   Elliot Cousin, MD  NUVIGIL 250 MG tablet Take 250 mg by mouth daily. 01/01/15   Historical Provider, MD   BP 135/83 mmHg  Pulse 74  Temp(Src) 97.8 F (36.6 C) (Oral)  Resp 20  Ht  (1.88 m)  Wt 200 lb (90.719 kg)  BMI 25.67 kg/m2  SpO2 97% Physical Exam  Nursing note and vitals reviewed.  50 year old male, resting comfortably and in no acute distress. Vital signs are normal. Oxygen saturation is 97%, which is normal. Head is normocephalic and atraumatic. PERRLA, EOMI. Oropharynx is clear. Neck is nontender and supple without adenopathy or JVD. Back is nontender and there  is no CVA tenderness. Lungs are clear without rales, wheezes, or rhonchi. Chest is nontender. Heart has regular rate and rhythm without murmur. Abdomen is soft, flat, nontender without masses or hepatosplenomegaly and peristalsis is normoactive. Extremities have no cyanosis or edema, full range of motion is present. Skin is warm and dry without rash. Neurologic: Mental status is normal, cranial nerves are intact, there are no motor or sensory  deficits. Psychiatric: Depressed affect, although he does make good eye contact and speaks with normal inflections.  ED Course  Procedures (including critical care time) Labs Review Results for orders placed or performed during the hospital encounter of 04/15/16  Comprehensive metabolic panel  Result Value Ref Range   Sodium 138 135 - 145 mmol/L   Potassium 3.3 (L) 3.5 - 5.1 mmol/L   Chloride 110 101 - 111 mmol/L   CO2 20 (L) 22 - 32 mmol/L   Glucose, Bld 106 (H) 65 - 99 mg/dL   BUN 18 6 - 20 mg/dL   Creatinine, Ser 0.981.02 0.61 - 1.24 mg/dL   Calcium 9.4 8.9 - 11.910.3 mg/dL   Total Protein 8.1 6.5 - 8.1 g/dL   Albumin 4.5 3.5 - 5.0 g/dL   AST 27 15 - 41 U/L   ALT 45 17 - 63 U/L   Alkaline Phosphatase 129 (H) 38 - 126 U/L   Total Bilirubin 0.5 0.3 - 1.2 mg/dL   GFR calc non Af Amer >60 >60 mL/min   GFR calc Af Amer >60 >60 mL/min   Anion gap 8 5 - 15  Acetaminophen level  Result Value Ref Range   Acetaminophen (Tylenol), Serum <10 (L) 10 - 30 ug/mL  Salicylate level  Result Value Ref Range   Salicylate Lvl <4.0 2.8 - 30.0 mg/dL  Ethanol  Result Value Ref Range   Alcohol, Ethyl (B) <5 <5 mg/dL  CBC with Differential  Result Value Ref Range   WBC 10.4 4.0 - 10.5 K/uL   RBC 5.37 4.22 - 5.81 MIL/uL   Hemoglobin 17.3 (H) 13.0 - 17.0 g/dL   HCT 14.748.6 82.939.0 - 56.252.0 %   MCV 90.5 78.0 - 100.0 fL   MCH 32.2 26.0 - 34.0 pg   MCHC 35.6 30.0 - 36.0 g/dL   RDW 13.013.3 86.511.5 - 78.415.5 %   Platelets 347 150 - 400 K/uL   Neutrophils Relative % 62 %   Neutro Abs 6.5 1.7 - 7.7 K/uL   Lymphocytes Relative 23 %   Lymphs Abs 2.4 0.7 - 4.0 K/uL   Monocytes Relative 10 %   Monocytes Absolute 1.1 (H) 0.1 - 1.0 K/uL   Eosinophils Relative 4 %   Eosinophils Absolute 0.4 0.0 - 0.7 K/uL   Basophils Relative 1 %   Basophils Absolute 0.1 0.0 - 0.1 K/uL  Urine rapid drug screen (hosp performed)  Result Value Ref Range   Opiates NONE DETECTED NONE DETECTED   Cocaine NONE DETECTED NONE DETECTED    Benzodiazepines NONE DETECTED NONE DETECTED   Amphetamines NONE DETECTED NONE DETECTED   Tetrahydrocannabinol POSITIVE (A) NONE DETECTED   Barbiturates NONE DETECTED NONE DETECTED   I have personally reviewed and evaluated these images and lab results as part of my medical decision-making.   EKG Interpretation   Date/Time:  Friday April 15 2016 04:38:25 EDT Ventricular Rate:  77 PR Interval:    QRS Duration: 95 QT Interval:  392 QTC Calculation: 444 R Axis:   53 Text Interpretation:  Sinus rhythm Normal ECG When compared with ECG  of  02/10/2015, No significant change was found Confirmed by [facility]  MD, Shigeru Lampert  ([id number]) on 04/15/2016 4:41:10 AM      MDM   Final diagnoses:  Drug overdose, intentional self-harm, initial encounter (HCC)  Suicidal ideation    Drug overdose with suicidal intent. Given his normal blood pressure and heart rate on this long after ingestion, I do not believe that he is any danger from the medication that he took. Screening labs are being obtained as well his acetaminophen level and salicylate level. ECG will be checked. Case has been discussed with both [place] poison control who agree with this management and state that if liver enzymes are normal and acetaminophen level normal and ECG unremarkable that he would be medically cleared from their standpoint. Old records were reviewed and he did have [facility] with suicidal ideation.  ECG is unremarkable and laboratory workup was significant only for potassium of 3.3. Is given a dose of oral potassium. He is considered medically cleared at this point and is placed in psychiatric holding pending evaluation by TTS.  Dione Boozeavid Jaishaun Mcnab, MD 04/15/16 0630

## 2016-04-15 NOTE — ED Notes (Signed)
Pt wanded by security at this time  ?

## 2016-04-15 NOTE — ED Notes (Signed)
[facility] Requests that pt not leave facility until 6pm. Charge RN notified.

## 2016-04-15 NOTE — Tx Team (Signed)
Initial Interdisciplinary Treatment Plan   PATIENT STRESSORS: Financial difficulties Marital or family conflict   PATIENT STRENGTHS: Wellsite geologistCommunication skills General fund of knowledge   PROBLEM LIST: Problem List/Patient Goals Date to be addressed Date deferred Reason deferred Estimated date of resolution  Depression 04/15/16     Suicidal ideation 04/15/16     Anxiety 04/15/16     "Get all my medications straight" 04/15/16     Feel ber and have some ambition" 04/15/16                              DISCHARGE CRITERIA:  Improved stabilization in mood, thinking, and/or behavior Verbal commitment to aftercare and medication compliance  PRELIMINARY DISCHARGE PLAN: Outpatient therapy Medication management  PATIENT/FAMIILY INVOLVEMENT: This treatment plan has been presented to and reviewed with the patient, Wesley Giles.  The patient and family have been given the opportunity to ask questions and make suggestions.  Norm ParcelHeather V Jerrell Mangel 04/15/2016, 9:27 PM

## 2016-04-15 NOTE — ED Notes (Signed)
Poison control updated and has cleared pt.

## 2016-04-15 NOTE — ED Notes (Signed)
TTS consult in process. 

## 2016-04-15 NOTE — BHH Counselor (Signed)
[facility] Hein[facility], pt has been accepted to bed [facility]1. Pt can leave ED at 5 pm. Attending MD is Cobos. Writer notified pt's RN Trula OreChristina.   Evette Cristalaroline Paige Villa Burgin, [place] Therapeutic Triage Specialist
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
## 2016-04-15 NOTE — ED Notes (Signed)
[place] ETA 1 hr.

## 2016-04-15 NOTE — ED Notes (Signed)
Breakfast meal tray placed at bedside. Pt still asleep.

## 2016-04-16 DIAGNOSIS — F332 Major depressive disorder, recurrent severe without psychotic features: Principal | ICD-10-CM

## 2016-04-16 DIAGNOSIS — R45851 Suicidal ideations: Secondary | ICD-10-CM

## 2016-04-16 MED ORDER — ARIPIPRAZOLE 5 MG PO TABS
5.0000 mg | ORAL_TABLET | Freq: Every day | ORAL | Status: DC
  Administered 2016-04-16 – 2016-04-21 (×6): 5 mg via ORAL
  Filled 2016-04-16 (×10): qty 1

## 2016-04-16 MED ORDER — FLUOXETINE HCL 20 MG PO CAPS
40.0000 mg | ORAL_CAPSULE | Freq: Every day | ORAL | Status: DC
  Administered 2016-04-16 – 2016-04-19 (×4): 40 mg via ORAL
  Filled 2016-04-16 (×6): qty 2

## 2016-04-16 MED ORDER — AZITHROMYCIN 250 MG PO TABS
250.0000 mg | ORAL_TABLET | Freq: Every day | ORAL | Status: AC
  Administered 2016-04-17 – 2016-04-20 (×4): 250 mg via ORAL
  Filled 2016-04-16 (×4): qty 1

## 2016-04-16 MED ORDER — PANTOPRAZOLE SODIUM 40 MG PO TBEC
40.0000 mg | DELAYED_RELEASE_TABLET | Freq: Every day | ORAL | Status: DC
  Administered 2016-04-16 – 2016-04-21 (×6): 40 mg via ORAL
  Filled 2016-04-16 (×9): qty 1

## 2016-04-16 MED ORDER — AZITHROMYCIN 500 MG PO TABS
500.0000 mg | ORAL_TABLET | Freq: Every day | ORAL | Status: AC
  Administered 2016-04-16: 500 mg via ORAL
  Filled 2016-04-16: qty 2
  Filled 2016-04-16: qty 1

## 2016-04-16 MED ORDER — OLANZAPINE 10 MG PO TABS
10.0000 mg | ORAL_TABLET | Freq: Every day | ORAL | Status: DC
  Filled 2016-04-16 (×2): qty 1

## 2016-04-16 NOTE — BHH Group Notes (Signed)
The focus of this group is to educate the patient on the purpose and policies of crisis stabilization and provide a format to answer questions about their admission.  The group details unit policies and expectations of patients while admitted.  Patient attended 0900 nurse education orientation group this morning.  Patient actively participated and had appropriate affect.  Patient was alert.  Patent had appropriate insight and appropriate engagement.  Today patient will work on 3 goals for discharge.   
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
## 2016-04-16 NOTE — Progress Notes (Addendum)
D:  Patient's self inventory sheet, patient sleeps good, no sleep medication given.  Good appetite, normal energy level, poor concentration.  Rated depression 6, hopeless and anxiety 7.  Suboxone, diarrhea, cravings, cramping, agitation, runny nose, irritability.  SI, contracts for safety, no plan.  Physical problems lightheaded, pain, headaches.  Worst pain in past 24 hours #7, low back/legs.  Goal is to get back on suboxone.  Will discuss meds with MD.   No discharge plans.  No where to go after discharge.  A:  Medications administered per MD orders.  Emotional support and encouragement given patient. R:  Denied HI.  Denied A/V hallucinations.  SI, no plan, contracts for safety.  Safety maintained with 15 minute checks.
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
## 2016-04-16 NOTE — Plan of Care (Signed)
Problem: Education: Goal: Ability to state activities that reduce stress will improve Outcome: Progressing Nurse discussed anxiety/coping skills with patient.    

## 2016-04-16 NOTE — [facility] Group Notes (Signed)
[facility] LCSW Group Therapy Note  04/16/2016 10:15 - 11:15 AM  Type of Therapy and Topic:  Group Therapy: Avoiding Self-Sabotaging and Enabling Behaviors  Participation Level:  Minimal  Participation Quality: Sharing when encouraged to do so  Affect:  Flat  Cognitive:  Alert  Insight:  Developing  Engagement in Therapy:  Developing  Therapeutic models used Cognitive Behavioral Therapy Person-Centered Therapy Motivational Interviewing   Summary of Patient Progress: The main focus of today's process group was for the patient to identify ways in which they have in the past sabotaged their own recovery. Motivational Interviewing was utilized to ask the group members what they get out of their self sabotaging behavior(s), and what reasons they may have for wanting to change. The Stages of Change were explained using a handout, and patients identified where they currently are with regard to stages of change. Patient appeared somewhat hesitant perhaps due to recent admit. Pt processed lack of supports and she shame he has felt about losing his home. Carney Bernatherine C Harrill, LCSW

## 2016-04-17 MED ORDER — GABAPENTIN 100 MG PO CAPS
200.0000 mg | ORAL_CAPSULE | Freq: Three times a day (TID) | ORAL | Status: DC
  Administered 2016-04-17 – 2016-04-18 (×3): 200 mg via ORAL
  Filled 2016-04-17 (×11): qty 2

## 2016-04-17 NOTE — Progress Notes (Signed)
D:  Patient's self inventory sheet, patient has fair sleep, no sleep medication given.  Fair appetite, low energy level, poor concentration.  Rated depression and hopeless 9, anxiety 8.  Withdrawals, diarrhea, chilling, cravings, cramping, agitation, nausea, runny nose, irritability.  Denied SI.  Physical problems, pain, back, legs.  Worst pain #8 in past 24 hours.  Pain medication is not working.  Goal is to go to a place that will give suboxone.  Plans to complain to MD.  No discharge plans at this time.  Needs to get his meds straight.   A:  Medications administered per MD orders.  Emotional support and encouragement. R:  Denied SI and HI, contracts for safety.  Denied A/V hallucinations.  Safety maintained with 15 minute checks.

## 2016-04-17 NOTE — [facility] Group Notes (Signed)
[facility] LCSW Group Therapy  04/17/2016 10:00 AM  Type of Therapy:  Group Therapy and Activity to Identify signs of Improvement or Decompensation   Participation Level:  Did Not Attend despite overhead announcement and CSW encouragement  Carney Bernatherine C Michol Emory, LCSW

## 2016-04-17 NOTE — [facility] Suicide Risk Assessment (Signed)
[facility] INPATIENT:  Family/Significant Other Suicide Prevention Education  Suicide Prevention Education:  Patient Refusal for Family/Significant Other Suicide Prevention Education: The patient Wesley Giles has refused to provide written consent for family/significant other to be provided Family/Significant Other Suicide Prevention Education during admission and/or prior to discharge.  Physician notified.  Wesley Giles, Wesley Giles 04/17/2016, 12:42 PM

## 2016-04-17 NOTE — [facility] Group Notes (Signed)

## 2016-04-17 NOTE — Progress Notes (Signed)
Adult Psychoeducational Group Note  Date:  04/17/2016 Time:  12:09 AM  Group Topic/Focus:  Wrap-Up Group:   The focus of this group is to help patients review their daily goal of treatment and discuss progress on daily workbooks.  Participation Level:  Minimal  Participation Quality:  Inattentive  Affect:  Appropriate  Cognitive:  Lacking  Insight: Limited  Engagement in Group:  Lacking  Modes of Intervention:  Discussion and Support  Additional Comments:  Patient attended group, but did not participate.  Gabriele Loveland W Daquan Crapps 04/17/2016, 12:09 AM

## 2016-04-17 NOTE — Progress Notes (Signed)
Patient has been up and down from his room requesting prns for his withdrawal symptons. He attended group this evening but no participation. His blood pressure is radually coming down from since being admitted. Writer encouraged fluids through the night. He denies si/hi/a/v hallucinations. Support given and safety maintained on unit with 15 min checks.

## 2016-04-17 NOTE — Progress Notes (Signed)
[facility] MD Progress Note  04/17/2016 11:00 AM Wesley Giles  MRN:  [id number] Subjective:   "Not to good. Having bad withdrawal symptoms. My back, legs, my stomach. Clonidine helps a little bit but not that much. I even took some Robaxin for the muscle aches. I am sweating and having chills but I found showers to help calm me. Dr. Had mentioned for me to go back to [facility] today.   Objective: He reports resting well last night. He also notes an increase in his withdrawal symtpoms, detox treatment is going fairly well. Upon discharge he is hoping to get to return to [facility] to resume treatment for Suboxone.  Patient is not considering outpatient drug rehab therapy at this time, as stated he would like to resume therapy.   Denies any SI/HI/AVH or psychosis. He continues to report depression and ongoing anxiety. He currently rates his depression 8/10 and anxiety 8/10 with 0 being the least and 10 being the worse. "I dont feel good at all."  Principal Problem: MDD (major depressive disorder), recurrent severe, without psychosis (HCC) Diagnosis:   Patient Active Problem List   Diagnosis Date Noted  . MDD (major depressive disorder), recurrent severe, without psychosis (HCC) [F33.2] 04/15/2016  . Hyponatremia [E87.1] 02/10/2015  . Pain in the chest [R07.9]   . Chest pain [R07.9] 02/09/2015  . HTN (hypertension) [I10] 02/09/2015  . History of narcotic addiction (HCC) [F11.21] 02/09/2015   Total Time spent with patient: 30 minutes  Past Psychiatric History: MDD, Substance abuse  Past Medical History:  Past Medical History  Diagnosis Date  . History of narcotic addiction (HCC) 02/09/2015  . Depression   . Hypertension     Past Surgical History  Procedure Laterality Date  . Hernia repair    . Cholecystectomy    . Depression     Family History: History reviewed. No pertinent family history. Family Psychiatric  History: See HPI Social History:  History  Alcohol Use No      History  Drug Use  . Yes  . Special: Marijuana    Comment: last use 6/31/17    Social History   Social History  . Marital Status: Married    Spouse Name: N/A  . Number of Children: N/A  . Years of Education: N/A   Social History Main Topics  . Smoking status: Current Every Day Smoker -- 1.00 packs/day    Types: Cigarettes  . Smokeless tobacco: None  . Alcohol Use: No  . Drug Use: Yes    Special: Marijuana     Comment: last use 6/31/17  . Sexual Activity: Not Asked   Other Topics Concern  . None   Social History Narrative   Additional Social History:    Pain Medications: see PTA meds Prescriptions: see PTA meds Over the Counter: see PTA meds History of alcohol / drug use?: Yes Name of Substance 1: Marijuana 1 - Age of First Use: unknown 1 - Amount (size/oz): blunt 1 - Frequency: occasionally  1 - Duration: on and off 1 - Last Use / Amount: Last week    Sleep: Fair  Appetite:  Fair  Current Medications: Current Facility-Administered Medications  Medication Dose Route Frequency Provider Last Rate Last Dose  . acetaminophen (TYLENOL) tablet 650 mg  650 mg Oral Q6H PRN Beau Fanny, FNP   650 mg at 04/16/16 2225  . alum & mag hydroxide-simeth (MAALOX/MYLANTA) 200-200-20 MG/5ML suspension 30 mL  30 mL Oral Q4H PRN Beau Fanny, FNP  30 mL at 04/16/16 1620  . ARIPiprazole (ABILIFY) tablet 5 mg  5 mg Oral Daily Truman Hayward, FNP   5 mg at 04/17/16 0749  . azithromycin (ZITHROMAX) tablet 250 mg  250 mg Oral QPC supper Truman Hayward, FNP      . cloNIDine (CATAPRES) tablet 0.1 mg  0.1 mg Oral QID Beau Fanny, FNP   0.1 mg at 04/17/16 0749   Followed by  . [START ON 04/18/2016] cloNIDine (CATAPRES) tablet 0.1 mg  0.1 mg Oral BH-qamhs Beau Fanny, FNP       Followed by  . [START ON 04/21/2016] cloNIDine (CATAPRES) tablet 0.1 mg  0.1 mg Oral QAC breakfast Beau Fanny, FNP      . dicyclomine (BENTYL) tablet 20 mg  20 mg Oral Q6H PRN Beau Fanny, FNP    20 mg at 04/17/16 0755  . FLUoxetine (PROZAC) capsule 40 mg  40 mg Oral Daily Craige Cotta, MD   40 mg at 04/17/16 0749  . hydrOXYzine (ATARAX/VISTARIL) tablet 25 mg  25 mg Oral Q6H PRN Beau Fanny, FNP   25 mg at 04/17/16 0804  . lisinopril (PRINIVIL,ZESTRIL) tablet 20 mg  20 mg Oral Daily Beau Fanny, FNP   20 mg at 04/17/16 0749  . loperamide (IMODIUM) capsule 2-4 mg  2-4 mg Oral PRN Beau Fanny, FNP   2 mg at 04/17/16 0800  . magnesium hydroxide (MILK OF MAGNESIA) suspension 30 mL  30 mL Oral Daily PRN Beau Fanny, FNP      . methocarbamol (ROBAXIN) tablet 500 mg  500 mg Oral Q8H PRN Beau Fanny, FNP   500 mg at 04/17/16 0550  . naproxen (NAPROSYN) tablet 500 mg  500 mg Oral BID PRN Beau Fanny, FNP   500 mg at 04/17/16 0758  . nicotine (NICODERM CQ - dosed in mg/24 hours) patch 21 mg  21 mg Transdermal Daily Craige Cotta, MD   21 mg at 04/17/16 0750  . ondansetron (ZOFRAN-ODT) disintegrating tablet 4 mg  4 mg Oral Q6H PRN Beau Fanny, FNP      . pantoprazole (PROTONIX) EC tablet 40 mg  40 mg Oral Daily Truman Hayward, FNP   40 mg at 04/17/16 0750    Lab Results: No results found for this or any previous visit (from the past 48 hour(s)).  Blood Alcohol level:  Lab Results  Component Value Date   ETH <5 04/15/2016    Metabolic Disorder Labs: No results found for: HGBA1C, MPG No results found for: PROLACTIN No results found for: CHOL, TRIG, HDL, CHOLHDL, VLDL, LDLCALC  Physical Findings: AIMS: Facial and Oral Movements Muscles of Facial Expression: None, normal Lips and Perioral Area: None, normal Jaw: None, normal Tongue: None, normal,Extremity Movements Upper (arms, wrists, hands, fingers): None, normal Lower (legs, knees, ankles, toes): None, normal, Trunk Movements Neck, shoulders, hips: None, normal, Overall Severity Severity of abnormal movements (highest score from questions above): None, normal Incapacitation due to abnormal movements: None,  normal Patient's awareness of abnormal movements (rate only patient's report): No Awareness, Dental Status Current problems with teeth and/or dentures?: No Does patient usually wear dentures?: No  CIWA:  CIWA-Ar Total: 1 COWS:  COWS Total Score: 3  Musculoskeletal: Strength & Muscle Tone: within normal limits Gait & Station: normal Patient leans: N/A  Psychiatric Specialty Exam: Physical Exam  ROS  Blood pressure 104/81, pulse 79, temperature 97.9 F (36.6 C), temperature source Oral, resp.  rate 18, height 6\' 2"  (1.88 m), weight 96.163 kg (212 lb), SpO2 99 %.Body mass index is 27.21 kg/(m^2).  General Appearance: Disheveled  Eye Contact:  Minimal  Speech:  Pressured  Volume:  Decreased  Mood:  Anxious and Hopeless  Affect:  Constricted, Depressed and Inappropriate  Thought Process:  Linear  Orientation:  Full (Time, Place, and Person)  Thought Content:  Logical and Rumination  Suicidal Thoughts:  No  Homicidal Thoughts:  No  Memory:  Immediate;   Fair Recent;   Fair  Judgement:  Impaired  Insight:  Lacking  Psychomotor Activity:  Decreased  Concentration:  Concentration: Fair and Attention Span: Fair  Recall:  [employer]Fair  Fund of Knowledge:  Fair  Language:  Fair  Akathisia:  No  Handed:  Right  AIMS (if indicated):     Assets:  Communication Skills Desire for Improvement Financial Resources/Insurance Leisure Time Physical Health Social Support  ADL's:  Intact  Cognition:  Impaired,  Mild  Sleep:  Number of Hours: 6.75     Treatment Plan Summary: Daily contact with patient to assess and evaluate symptoms and progress in treatment and Medication management  Depression/insomnia: Continue the Prozac 40mg  po daily  Anxiety/tension:continue Clonidine protocol for detox, and severe anxiety/agitation, Hydroxyzine 25 mg qid for mild anxiety. Continue to work with mindfulness, CBT help identify the cognitive distortions that keep the depression going.   Discuss other life style changes that can help with both his depression and his cocaine use such as exercise, meditation Continue Abilify 5mg  po daily for mood stabilization (started today)   Anger/rage/SIHI threats: Obtain spiritual consult, continue the antipsychotic medications.  Sinus infection:Z-pack, take as directed.   -Nausea: Zofran ODT 4mg  po q8h prn nausea -Add multivitamin to stimulate appetite -Smoking cessation-Nicotine 21mg  patch, apply once daily.  -Detox: Continue CLonidine protocol, Robaxin, Zofran, Imodium, and Bentyl for detox symptoms.   -Treatment is working on disposition. Pt is [facility], where he can resume his Suboxone therapy. He is aware that his UDS was positive for THC, and negative for Amphetamines (which he is prescribed). Will not resume any controlled substances at this time. Will discuss with SW to facilitate facility transfer, or discharge patient.   Truman Haywardakia S Starkes, FNP 04/17/2016, 11:00 AM  I reviewed chart and agreed with the findings and treatment Plan.  Kathryne SharperSyed Shahil Speegle, MD

## 2016-04-17 NOTE — Plan of Care (Signed)
Problem: Education: Goal: Utilization of techniques to improve thought processes will improve Outcome: Progressing Nurse discussed depression/coping skills with patient.    
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
## 2016-04-18 MED ORDER — HYDROXYZINE HCL 50 MG PO TABS
50.0000 mg | ORAL_TABLET | Freq: Three times a day (TID) | ORAL | Status: DC | PRN
  Administered 2016-04-18 – 2016-04-19 (×3): 50 mg via ORAL
  Filled 2016-04-18 (×3): qty 1

## 2016-04-18 MED ORDER — GABAPENTIN 300 MG PO CAPS
300.0000 mg | ORAL_CAPSULE | Freq: Three times a day (TID) | ORAL | Status: DC
  Administered 2016-04-18 – 2016-04-20 (×5): 300 mg via ORAL
  Filled 2016-04-18 (×9): qty 1

## 2016-04-18 NOTE — Progress Notes (Signed)
The focus of this group is to help patients review their daily goal of treatment and discuss progress on daily workbooks.  Patient attended and participated in group tonight. Patient states that his goal was to get through today. Patient states that his day wasn't worth rating. Patient was encouraged to speak with the doctor and social worker. Patient states that his days seem to be getting worse instead of better.

## 2016-04-18 NOTE — Progress Notes (Signed)
Recreation Therapy Notes  Date: 07.10.2017 Time: 9:30am  Location: [address] Group Room   Group Topic: Stress Management  Goal Area(s) Addresses:  Patient will actively participate in stress management techniques presented during session.   Behavioral Response: Did not attend.   Marykay Lexenise L Briyonna Omara, LRT/CTRS        Amarissa Koerner L 04/18/2016 10:23 AM

## 2016-04-18 NOTE — Progress Notes (Signed)
D: Pt anxious on approach. Pt reports withdrawal symptoms of tremors, chills, cravings, agitation, nausea and irritability. Pt given prn meds per for discomfort. Pt appears fidgety, grimacing and anxious. Pt easily agitated at noon time because he did not have a scheduled dose of clonidine available. Writer explained to pt that clonidine is part of a detox protocol. Pt verbalized that vistaril is not effective for anxiety. Writer made May, NP., aware and vistaril increased to 50 mg every 8 hours.  A: Medications reviewed with pt. Medications administered as ordered per MD. Verbal support provided. Pt encouraged to attend groups. 15 minute checks performed for safety. R: Pt receptive to tx.

## 2016-04-18 NOTE — Progress Notes (Signed)
Patient has been up in the dayroom and attended group this evening. He requested his medications about 5-10 minutes early c/o withdrawal symptoms, aching and cramping. He reports that he is hoping he will be discharged on tomorrow so that he can go to [facility] about his suboxone. Support and encouragement given, safety maintained on unit with 15 min checks.
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
## 2016-04-18 NOTE — Tx Team (Signed)
Interdisciplinary Treatment Plan Update (Adult) Date: 04/18/2016   Date: 04/18/2016 1:11 PM  Progress in Treatment:  Attending groups: Yes  Participating in groups: Yes  Taking medication as prescribed: Yes  Tolerating medication: Yes  Family/Significant othe contact made: No, Pt declines Patient understands diagnosis: Yes AEB seeking help with depression Discussing patient identified problems/goals with staff: Yes  Medical problems stabilized or resolved: Yes  Denies suicidal/homicidal ideation: Yes Patient has not harmed self or Others: Yes   New problem(s) identified: None identified at this time.   Discharge Plan or Barriers: CSW will assess for appropriate discharge plan and relevant barriers. Pt is homeless currently and does not know where to go.  Additional comments:  Patient and CSW reviewed pt's identified goals and treatment plan. Patient verbalized understanding and agreed to treatment plan.   Reason for Continuation of Hospitalization:  Depression Medication stabilization Suicidal ideation Withdrawal symptoms  Estimated length of stay: 3-5 days  Review of initial/current patient goals per problem list:   1.  Goal(s): Patient will participate in aftercare plan  Met:  No  Target date: 3-5 days from date of admission   As evidenced by: Patient will participate within aftercare plan AEB aftercare provider and housing plan at discharge being identified.  04/18/16: CSW to work with Pt to assess for appropriate discharge plan and faciliate appointments and referrals as needed prior to d/c.  2.  Goal (s): Patient will exhibit decreased depressive symptoms and suicidal ideations.  Met:  No  Target date: 3-5 days from date of admission   As evidenced by: Patient will utilize self rating of depression at 3 or below and demonstrate decreased signs of depression or be deemed stable for discharge by MD.  04/18/16: Pt is reporting high levels of depression, rating 9/10;  denies SI  4.  Goal(s): Patient will demonstrate decreased signs of withdrawal due to substance abuse  Met:  No  Target date: 3-5 days from date of admission   As evidenced by: Patient will produce a CIWA/COWS score of 0, have stable vitals signs, and no symptoms of withdrawal  04/18/16: COWS score of 5 with sweating, joint aches, tremor, and anxiety/irritability.  Attendees:  Patient:    Family:    Physician: Dr. Parke Poisson, MD  04/18/2016 1:11 PM  Nursing: Darrol Angel, RN; Marcella Dubs, RN 04/18/2016 1:11 PM  Clinical Social Worker Peri Maris, [place] 04/18/2016 1:11 PM  Other: Tilden Fossa, LCSWA 04/18/2016 1:11 PM  Clinical: Lars Pinks, RN Case manager  04/18/2016 1:11 PM  Other:  04/18/2016 1:11 PM  Other:     Peri Maris, [place] Social Work [phone]

## 2016-04-18 NOTE — [facility] Group Notes (Signed)
[facility] LCSW Group Therapy  04/18/2016 1:15pm  Type of Therapy:  Group Therapy vercoming Obstacles  Pt did not attend, declined invitation.    Chad CordialLauren Carter, LCSWA 04/18/2016 5:33 PM

## 2016-04-19 MED ORDER — CITALOPRAM HYDROBROMIDE 20 MG PO TABS
20.0000 mg | ORAL_TABLET | Freq: Every day | ORAL | Status: DC
  Administered 2016-04-20 – 2016-04-21 (×2): 20 mg via ORAL
  Filled 2016-04-19 (×4): qty 1

## 2016-04-19 MED ORDER — HYDROXYZINE HCL 50 MG PO TABS
50.0000 mg | ORAL_TABLET | Freq: Four times a day (QID) | ORAL | Status: DC | PRN
  Administered 2016-04-19 – 2016-04-21 (×8): 50 mg via ORAL
  Filled 2016-04-19 (×8): qty 1

## 2016-04-19 MED ORDER — CITALOPRAM HYDROBROMIDE 20 MG PO TABS: 20.0000 mg | ORAL_TABLET | Freq: Every day | ORAL | Status: DC

## 2016-04-19 NOTE — Progress Notes (Signed)
D: Pt on unit walking the hall and into the dayroom.  Pt is pleasant and verbally responsive to questions.  Pt is friendly and interacts with other patients.  Pt denies SI, HI and AVH.  Pt does complain of some aches and pains and describes himself as anxious and nervous.  Pt asking about when he can have his medications. A:  Pt given meds per MD order.  Pt is give emotional support. R:  Pt continues to be monitored q15 min.  Pt remains safe.

## 2016-04-19 NOTE — [facility] Group Notes (Signed)
[facility] LCSW Group Therapy 04/19/2016  1:15 PM   Type of Therapy: Group Therapy  Participation Level: Did Not Participate. Patient left at the beginning of group discussion and did not return.    Samuella BruinKristin Annika Selke, MSW, LCSW Clinical Social Worker [facility] [phone]

## 2016-04-19 NOTE — Progress Notes (Signed)
Patient ID: Wesley Giles, male   DOB: [birth year], 50 y.o.   MRN: [id number] D   ---  Pt. Agrees to contract for safety and denies pain  At this time.  He is pl;esant and cooperative with fair eye contact.  Pt. Interacts well with peers and spends his time in the dayroom.  He complains of anxiety and is medicated (see MAR) with good results.  Pt. Attends group this AM with moderate participation and was attentive and  supportive of his peers.    He appears deschiveled  And asked for use of the tub room, but no MHT was available.  his goal for today is to get [facility] ( reasons un-known to staff).   In group , pt, said his main coping skill was his dog, but his x-wife took the pet with her when she left. Pt. Takes meds as asked and shows no sign of adverse effects.  ---  A ---  Su[pport and encouragement provided  --- R ---  Pt. Remain safe on unit

## 2016-04-19 NOTE — Progress Notes (Signed)
D: Pt who is very flat also endorsed severe anxiety and severe depression; states, "I feel very terrible right now." Pt however denied SI. Pt also endorsed severe generalized pain; states, "I whole body hurt; I am definitely withdrawing." Pt was isolated and withdrawn to self even while in the dayroom. Pt remained calm and cooperative. A: Medications offered as prescribed.  Support, encouragement, and safe environment provided.  15-minute safety checks continue. R: Pt was med compliant. Pt attended wrap-up group. Safety checks continue.

## 2016-04-19 NOTE — Progress Notes (Addendum)
Patient ID: Wesley Giles, male   DOB: April 14, 1966, 50 y.o.   MRN: [id number] [facility] MD Progress Note  04/19/2016 12:45 PM Wesley Giles  MRN:  [id number]   Subjective:    Patient reports some ongoing symptoms of opiate withdrawal , primarily aches, pains, diarrhea. Denies medication side effects. Reports ongoing depression, sadness, but denies any current suicidal ideations .  Objective:  I have discussed case with treatment team and have met with patient . 50 year old male,who made a serious suicide attempt by overdosing several days ago. He reports several recent losses , including separation from wife . He has a history of Opiate Abuse, Chronic Pain- had been on Suboxone for a long period of time, which he states he was gradually weaning off of , as he felt Suboxone was making his depression worse. Of note, admission UDS is negative . Patient remains depressed, dysphoric, with a constricted and slightly irritable affect. He denies any suicidal ideations at this time, and is future oriented, expressing hope that his marital relationship will improve. At this time denies medication side effects . Of note, states he has been on Prozac for a long time, and was taking as prescribed in spite of which he became more depressed, he is interested in trying a new antidepressant, we discussed options, to include SSRI, SNRI, or Buproprion. Agrees to CELEXA trial, side effects discussed . As above, reports symptoms of opiate withdrawal , mainly aches, pains and some diarrhea, does not appear to be in any acute distress or severe withdrawal at this time. Denies vomiting .    Principal Problem: MDD (major depressive disorder), recurrent severe, without psychosis ([place]) Diagnosis:   Patient Active Problem List   Diagnosis Date Noted  . MDD (major depressive disorder), recurrent severe, without psychosis ([place]) [F33.2] 04/15/2016  . Hyponatremia [E87.1] 02/10/2015  . Pain in the chest [R07.9]   . Chest pain  [R07.9] 02/09/2015  . HTN (hypertension) [I10] 02/09/2015  . History of narcotic addiction ([place]) [F11.21] 02/09/2015   Total Time spent with patient:  20 minutes   Past Psychiatric History: MDD, Substance abuse  Past Medical History:  Past Medical History  Diagnosis Date  . History of narcotic addiction ([place]) 02/09/2015  . Depression   . Hypertension     Past Surgical History  Procedure Laterality Date  . Hernia repair    . Cholecystectomy    . Depression     Family History: History reviewed. No pertinent family history. Family Psychiatric  History: See HPI Social History:  History  Alcohol Use No     History  Drug Use  . Yes  . Special: Marijuana    Comment: last use 6/31/17    Social History   Social History  . Marital Status: Married    Spouse Name: N/A  . Number of Children: N/A  . Years of Education: N/A   Social History Main Topics  . Smoking status: Current Every Day Smoker -- 1.00 packs/day    Types: Cigarettes  . Smokeless tobacco: None  . Alcohol Use: No  . Drug Use: Yes    Special: Marijuana     Comment: last use 6/31/17  . Sexual Activity: Not Asked   Other Topics Concern  . None   Social History Narrative   Additional Social History:    Pain Medications: see PTA meds Prescriptions: see PTA meds Over the Counter: see PTA meds History of alcohol / drug use?: Yes Name of Substance 1: Marijuana 1 -  Age of First Use: unknown 1 - Amount (size/oz): blunt 1 - Frequency: occasionally  1 - Duration: on and off 1 - Last Use / Amount: Last week    Sleep: Fair  Appetite:  Fair  Current Medications: Current Facility-Administered Medications  Medication Dose Route Frequency Provider Last Rate Last Dose  . acetaminophen (TYLENOL) tablet 650 mg  650 mg Oral Q6H PRN Benjamine Mola, FNP   650 mg at 04/17/16 1204  . alum & mag hydroxide-simeth (MAALOX/MYLANTA) 200-200-20 MG/5ML suspension 30 mL  30 mL Oral Q4H PRN Benjamine Mola, FNP   30 mL at  04/16/16 1620  . ARIPiprazole (ABILIFY) tablet 5 mg  5 mg Oral Daily Nanci Pina, FNP   5 mg at 04/19/16 0817  . azithromycin (ZITHROMAX) tablet 250 mg  250 mg Oral QPC supper Nanci Pina, FNP   250 mg at 04/18/16 1725  . [START ON 04/20/2016] citalopram (CELEXA) tablet 20 mg  20 mg Oral Daily Maile Linford A Pruitt Taboada, MD      . cloNIDine (CATAPRES) tablet 0.1 mg  0.1 mg Oral BH-qamhs Benjamine Mola, FNP   0.1 mg at 04/19/16 1610   Followed by  . [START ON 04/21/2016] cloNIDine (CATAPRES) tablet 0.1 mg  0.1 mg Oral QAC breakfast Benjamine Mola, FNP      . dicyclomine (BENTYL) tablet 20 mg  20 mg Oral Q6H PRN Benjamine Mola, FNP   20 mg at 04/17/16 1610  . gabapentin (NEURONTIN) capsule 300 mg  300 mg Oral TID Ambrose Finland, MD   300 mg at 04/19/16 0617  . hydrOXYzine (ATARAX/VISTARIL) tablet 50 mg  50 mg Oral Q6H PRN Myer Peer Egor Fullilove, MD      . lisinopril (PRINIVIL,ZESTRIL) tablet 20 mg  20 mg Oral Daily Benjamine Mola, FNP   20 mg at 04/19/16 0817  . loperamide (IMODIUM) capsule 2-4 mg  2-4 mg Oral PRN Benjamine Mola, FNP   2 mg at 04/17/16 1612  . magnesium hydroxide (MILK OF MAGNESIA) suspension 30 mL  30 mL Oral Daily PRN Benjamine Mola, FNP      . methocarbamol (ROBAXIN) tablet 500 mg  500 mg Oral Q8H PRN Benjamine Mola, FNP   500 mg at 04/18/16 1725  . naproxen (NAPROSYN) tablet 500 mg  500 mg Oral BID PRN Benjamine Mola, FNP   500 mg at 04/18/16 1959  . nicotine (NICODERM CQ - dosed in mg/24 hours) patch 21 mg  21 mg Transdermal Daily Jenne Campus, MD   21 mg at 04/19/16 0800  . ondansetron (ZOFRAN-ODT) disintegrating tablet 4 mg  4 mg Oral Q6H PRN Benjamine Mola, FNP      . pantoprazole (PROTONIX) EC tablet 40 mg  40 mg Oral Daily Nanci Pina, FNP   40 mg at 04/19/16 9604    Lab Results: No results found for this or any previous visit (from the past 48 hour(s)).  Blood Alcohol level:  Lab Results  Component Value Date   ETH <5 54/06/8118    Metabolic Disorder  Labs: No results found for: HGBA1C, MPG No results found for: PROLACTIN No results found for: CHOL, TRIG, HDL, CHOLHDL, VLDL, LDLCALC  Physical Findings: AIMS: Facial and Oral Movements Muscles of Facial Expression: None, normal Lips and Perioral Area: None, normal Jaw: None, normal Tongue: None, normal,Extremity Movements Upper (arms, wrists, hands, fingers): None, normal Lower (legs, knees, ankles, toes): None, normal, Trunk Movements Neck, shoulders, hips: None, normal,  Overall Severity Severity of abnormal movements (highest score from questions above): None, normal Incapacitation due to abnormal movements: None, normal Patient's awareness of abnormal movements (rate only patient's report): No Awareness, Dental Status Current problems with teeth and/or dentures?: No Does patient usually wear dentures?: No  CIWA:  CIWA-Ar Total: 1 COWS:  COWS Total Score: 6  Musculoskeletal: Strength & Muscle Tone: within normal limits Gait & Station: normal Patient leans: N/A  Psychiatric Specialty Exam: Physical Exam  ROS denies headache, denies chest pain, describes some diffuse aches, a vague sense of restlessness and diarrhea, denies vomiting   Blood pressure 115/82, pulse 74, temperature 98.7 F (37.1 C), temperature source Oral, resp. rate 18, height 6' 2"  (1.88 m), weight 212 lb (96.163 kg), SpO2 99 %.Body mass index is 27.21 kg/(m^2).  General Appearance: Fairly Groomed  Eye Contact:  Fair  Speech:  Normal Rate  Volume:  Decreased  Mood:  Depressed  Affect:  Constricted  Thought Process:  Linear  Orientation:  Full (Time, Place, and Person)  Thought Content:  Denies hallucinations,  No delusions   Suicidal Thoughts:  No at this time denies plan or intention of hurting self or of SI and contracts for safety on the unit   Homicidal Thoughts:  No denies   Memory: recent and remote grossly intact   Judgement:  Fair   Insight:  Fair   Psychomotor Activity:  Decreased   Concentration:  Concentration: Good and Attention Span: Good  Recall:  Good  Fund of Knowledge:  Good  Language:  Good  Akathisia:  No  Handed:  Right  AIMS (if indicated):     Assets:  Communication Skills Desire for Improvement Financial Resources/Insurance Leisure Time Physical Health Social Support  ADL's:  Intact  Cognition:  Impaired,  Mild  Sleep:  Number of Hours: 5   Assessment - patient remains depressed, sad, with neuro-vegetative symptoms such as low energy, sadness, some insomnia. He is also describing some symptoms of opiate withdrawal, although states that he had been weaning down on Suboxone for several weeks  Because this medication was worsening his depression. He is tolerating medications well at this time. He denies active suicidal ideations and contracts for safety on the unit  Change from Prozac ( as felt not to be effective at this time) to Citalopram trial.  Treatment Plan Summary: Daily contact with patient to assess and evaluate symptoms and progress in treatment and Medication management Continue to encoruage group and milieu participation to work on coping skills and symptom reduction  Continue to encourage efforts to maintain sobriety, work on recovery Continue Clonidine detox to minimize opiate WDL symptoms Start Celexa 20 mgrs QDAY for depression and anxiety- side effects discussed D/C Prozac  Increase Vistaril to 50 mgrs Q 6 hours for severe anxiety, as needed  Continue Abilify 5 mgrs QDAY as an antidepressant augmentation strategy and for mood disorder  Continue Neurontin 300 mgrs TID for pain, anxiety    Madigan Rosensteel, Felicita Gage, MD 04/19/2016, 12:45 PM

## 2016-04-20 MED ORDER — CLONIDINE HCL 0.1 MG PO TABS
0.1000 mg | ORAL_TABLET | Freq: Four times a day (QID) | ORAL | Status: DC
  Administered 2016-04-20 – 2016-04-21 (×4): 0.1 mg via ORAL
  Filled 2016-04-20 (×6): qty 1

## 2016-04-20 MED ORDER — DICLOFENAC SODIUM 1 % TD GEL
2.0000 g | Freq: Four times a day (QID) | TRANSDERMAL | Status: DC
Start: 2016-04-20 — End: 2016-04-21
  Administered 2016-04-20 – 2016-04-21 (×5): 2 g via TOPICAL
  Filled 2016-04-20 (×2): qty 100

## 2016-04-20 MED ORDER — GABAPENTIN 400 MG PO CAPS
400.0000 mg | ORAL_CAPSULE | Freq: Three times a day (TID) | ORAL | Status: DC
Start: 2016-04-20 — End: 2016-04-21
  Administered 2016-04-20 – 2016-04-21 (×4): 400 mg via ORAL
  Filled 2016-04-20 (×10): qty 1

## 2016-04-20 MED ORDER — CLONIDINE HCL 0.1 MG PO TABS
0.1000 mg | ORAL_TABLET | ORAL | Status: DC
  Filled 2016-04-20 (×3): qty 1

## 2016-04-20 MED ORDER — CLONIDINE HCL 0.1 MG PO TABS: 0.1000 mg | ORAL_TABLET | Freq: Every day | ORAL | Status: DC

## 2016-04-20 NOTE — Progress Notes (Signed)
[facility] MD Progress Note  04/20/2016 10:53 AM Wesley Giles  MRN:  [id number]   Subjective:    Patient reports persistent symptoms of opiate withdrawal.  He also states that Clonidine does help with his pain.  Denies medication side effects.   Reports ongoing depression, sadness, but denies any current suicidal ideations .  Objective:  Patient is seen.  Discussed plan of care with team members . Wesley Giles, 50 year old male,who made a serious suicide attempt by overdosing several days ago. He reports that he was titrating down from opiates since moving from [place]. He has a history of Opiate Abuse, Chronic Pain- had been on Suboxone for a long period of time. Patient also states that Prozac had not seemed to work for him and that he had been on it for a period of time.   On Celexa, tolerating well. Denies any suicidal ideations.  No reports of adverse side effects.  Principal Problem: MDD (major depressive disorder), recurrent severe, without psychosis (HCC) Diagnosis:   Patient Active Problem List   Diagnosis Date Noted  . MDD (major depressive disorder), recurrent severe, without psychosis (HCC) [F33.2] 04/15/2016  . Hyponatremia [E87.1] 02/10/2015  . Pain in the chest [R07.9]   . Chest pain [R07.9] 02/09/2015  . HTN (hypertension) [I10] 02/09/2015  . History of narcotic addiction (HCC) [F11.21] 02/09/2015   Total Time spent with patient:  20 minutes   Past Psychiatric History: MDD, Substance abuse  Past Medical History:  Past Medical History  Diagnosis Date  . History of narcotic addiction (HCC) 02/09/2015  . Depression   . Hypertension     Past Surgical History  Procedure Laterality Date  . Hernia repair    . Cholecystectomy    . Depression     Family History: History reviewed. No pertinent family history. Family Psychiatric  History: See HPI Social History:  History  Alcohol Use No     History  Drug Use  . Yes  . Special: Marijuana    Comment: last use  6/31/17    Social History   Social History  . Marital Status: Married    Spouse Name: N/A  . Number of Children: N/A  . Years of Education: N/A   Social History Main Topics  . Smoking status: Current Every Day Smoker -- 1.00 packs/day    Types: Cigarettes  . Smokeless tobacco: None  . Alcohol Use: No  . Drug Use: Yes    Special: Marijuana     Comment: last use 6/31/17  . Sexual Activity: Not Asked   Other Topics Concern  . None   Social History Narrative   Additional Social History:    Pain Medications: see PTA meds Prescriptions: see PTA meds Over the Counter: see PTA meds History of alcohol / drug use?: Yes Name of Substance 1: Marijuana 1 - Age of First Use: unknown 1 - Amount (size/oz): blunt 1 - Frequency: occasionally  1 - Duration: on and off 1 - Last Use / Amount: Last week    Sleep: Fair  Appetite:  Fair  Current Medications: Current Facility-Administered Medications  Medication Dose Route Frequency Provider Last Rate Last Dose  . acetaminophen (TYLENOL) tablet 650 mg  650 mg Oral Q6H PRN Wesley Fanny, FNP   650 mg at 04/17/16 1204  . alum & mag hydroxide-simeth (MAALOX/MYLANTA) 200-200-20 MG/5ML suspension 30 mL  30 mL Oral Q4H PRN Wesley Fanny, FNP   30 mL at 04/16/16 1620  . ARIPiprazole (ABILIFY) tablet  5 mg  5 mg Oral Daily Truman Haywardakia S Starkes, FNP   5 mg at 04/20/16 [id number]  . azithromycin (ZITHROMAX) tablet 250 mg  250 mg Oral QPC supper Truman Haywardakia S Starkes, FNP   250 mg at 04/19/16 1822  . citalopram (CELEXA) tablet 20 mg  20 mg Oral Daily Craige CottaFernando A Colbe Viviano, MD   20 mg at 04/20/16 0808  . [START ON 04/21/2016] cloNIDine (CATAPRES) tablet 0.1 mg  0.1 mg Oral QAC breakfast Wesley FannyJohn C Withrow, FNP      . dicyclomine (BENTYL) tablet 20 mg  20 mg Oral Q6H PRN Wesley FannyJohn C Withrow, FNP   20 mg at 04/17/16 1610  . gabapentin (NEURONTIN) capsule 400 mg  400 mg Oral TID Adonis BrookSheila Agustin, NP      . hydrOXYzine (ATARAX/VISTARIL) tablet 50 mg  50 mg Oral Q6H PRN Craige CottaFernando A Geni Skorupski,  MD   50 mg at 04/20/16 0948  . lisinopril (PRINIVIL,ZESTRIL) tablet 20 mg  20 mg Oral Daily Wesley FannyJohn C Withrow, FNP   20 mg at 04/20/16 [id number]  . loperamide (IMODIUM) capsule 2-4 mg  2-4 mg Oral PRN Wesley FannyJohn C Withrow, FNP   2 mg at 04/17/16 1612  . magnesium hydroxide (MILK OF MAGNESIA) suspension 30 mL  30 mL Oral Daily PRN Wesley FannyJohn C Withrow, FNP      . methocarbamol (ROBAXIN) tablet 500 mg  500 mg Oral Q8H PRN Wesley FannyJohn C Withrow, FNP   500 mg at 04/20/16 [id number]  . naproxen (NAPROSYN) tablet 500 mg  500 mg Oral BID PRN Wesley FannyJohn C Withrow, FNP   500 mg at 04/20/16 0811  . nicotine (NICODERM CQ - dosed in mg/24 hours) patch 21 mg  21 mg Transdermal Daily Craige CottaFernando A Marrio Scribner, MD   21 mg at 04/20/16 0808  . ondansetron (ZOFRAN-ODT) disintegrating tablet 4 mg  4 mg Oral Q6H PRN Wesley FannyJohn C Withrow, FNP      . pantoprazole (PROTONIX) EC tablet 40 mg  40 mg Oral Daily Truman Haywardakia S Starkes, FNP   40 mg at 04/20/16 [id number]    Lab Results: No results found for this or any previous visit (from the past 48 hour(s)).  Blood Alcohol level:  Lab Results  Component Value Date   ETH <5 04/15/2016    Metabolic Disorder Labs: No results found for: HGBA1C, MPG No results found for: PROLACTIN No results found for: CHOL, TRIG, HDL, CHOLHDL, VLDL, LDLCALC  Physical Findings: AIMS: Facial and Oral Movements Muscles of Facial Expression: None, normal Lips and Perioral Area: None, normal Jaw: None, normal Tongue: None, normal,Extremity Movements Upper (arms, wrists, hands, fingers): None, normal Lower (legs, knees, ankles, toes): None, normal, Trunk Movements Neck, shoulders, hips: None, normal, Overall Severity Severity of abnormal movements (highest score from questions above): None, normal Incapacitation due to abnormal movements: None, normal Patient's awareness of abnormal movements (rate only patient's report): No Awareness, Dental Status Current problems with teeth and/or dentures?: No Does patient usually wear dentures?: No  CIWA:   CIWA-Ar Total: 1 COWS:  COWS Total Score: 5  Musculoskeletal: Strength & Muscle Tone: within normal limits Gait & Station: normal Patient leans: N/A  Psychiatric Specialty Exam: Physical Exam  Nursing note and vitals reviewed. Psychiatric: His mood appears anxious. Thought content is not paranoid. He exhibits a depressed mood. He expresses no homicidal and no suicidal ideation.    Review of Systems  Psychiatric/Behavioral: Negative for depression. The patient is nervous/anxious.   All other systems reviewed and are negative.  denies headache, denies chest pain,  describes some diffuse aches, a vague sense of restlessness and diarrhea, denies vomiting   Blood pressure 118/85, pulse 85, temperature 98.3 F (36.8 C), temperature source Oral, resp. rate 16, height 6\' 2"  (1.88 m), weight 96.163 kg (212 lb), SpO2 99 %.Body mass index is 27.21 kg/(m^2).  General Appearance: Fairly Groomed  Eye Contact:  Fair  Speech:  Normal Rate  Volume:  Decreased  Mood:  Depressed  Affect:  Constricted  Thought Process:  Linear  Orientation:  Full (Time, Place, and Person)  Thought Content:  Denies hallucinations,  No delusions   Suicidal Thoughts:  No   Homicidal Thoughts:  No   Memory: recent and remote grossly intact   Judgement:  Fair   Insight:  Fair   Psychomotor Activity:  Decreased  Concentration:  Concentration: Good and Attention Span: Good  Recall:  Good  Fund of Knowledge:  Good  Language:  Good  Akathisia:  No  Handed:  Right  AIMS (if indicated):     Assets:  Communication Skills Desire for Improvement Financial Resources/Insurance Leisure Time Physical Health Social Support  ADL's:  Intact  Cognition:  Impaired,  Mild  Sleep:  Number of Hours: 4.5   Assessment - Patient c/o back pain and sciatic pain to his right leg.  He states that the Clonidine works and is also helping with his pain.  Changed from Prozac ( as felt not to be effective at this time) to Citalopram trial.   Patient tolerating well.  Patient is requesting Suboxone treatment.  Informed that [facility] is unable to do Suboxone treatments.  Patient experiencing withdrawal symptoms.  Restarted Clonidine protocol.    Treatment Plan Summary: Daily contact with patient to assess and evaluate symptoms and progress in treatment and Medication management Continue to encoruage group and milieu participation to work on coping skills and symptom reduction  Continue to encourage efforts to maintain sobriety, work on recovery Restarted Clonidine detox protocol to minimize opiate WDL symptoms.       Cont. Celexa 20 mgrs QDAY for depression and anxiety- side effects discussed Added Voltaren gel to apply topically to tender areas.     Increase Vistaril to 50 mgrs Q 6 hours for severe anxiety, as needed  Continue Abilify 5 mgrs QDAY as an antidepressant augmentation strategy and for mood disorder  Increased Neurontin to 400 mgrs TID for pain, anxiety   Lindwood Qua, NP [facility] 04/20/2016, 10:53 AM  Agree with NP Progress Note, as above

## 2016-04-20 NOTE — Progress Notes (Signed)
D: Pt was in his room upon initial approach.  Pt has depressed affect and mood.  When asked about his day, pt states it was "not that good."  Pt reports his goal is to "just try to get to sleep."  Pt denies SI/HI, denies hallucinations.  Pt has been visible in milieu interacting with peers and staff appropriately.  He did not attend evening group.  Pt has been seeking medications, asking multiple times about when he can have a medication when staff has already answered him.  Pt reports withdrawal symptom of leg pain of 9/10. A: Introduced self to pt.  Met with pt and offered support and encouragement.  Actively listened to pt.  Medications administered per order.  PRN medication administered for pain and anxiety. R: Pt is compliant with medications.  Pt verbally contracts for safety.  Will continue to monitor and assess.

## 2016-04-20 NOTE — Progress Notes (Signed)
D: Pt presents with flat affect and depressed mood. Pt rates depression 10/10. Anxiety 10/10. Hopelessness 10/10. Pt reports withdrawal symptoms of tremors, chills, cramps, cravings, agitation and irritability. Pt appears tremulous and anxious on approach. Pt c/o back pain. Prn med given for discomfort. Pt stated that he continues to ask [facility]. Pt stated that he was receiving Suboxone treatment.  A: Medications reviewed with pt. Medications administered as ordered per MD. Verbal support provided. Pt encouraged to attend groups. 15 minute checks performed for safety.  R: Pt compliant with tx.

## 2016-04-20 NOTE — Progress Notes (Signed)
The focus of this group is to help patients review their daily goal of treatment and discuss progress on daily workbooks.  Patient came to group and participated. Patient states that his goal for the day was to work on getting transferred in the next few days. Patient rated his day as a 4 out of 10.

## 2016-04-20 NOTE — Progress Notes (Signed)
Recreation Therapy Notes   Date: 07.12.2017 Time: 9:30am Location: [address] Group Room   Group Topic: Stress Management  Goal Area(s) Addresses:  Patient will actively participate in stress management techniques presented during session.   Behavioral Response: Did not attend.   Marykay Lexenise L Jailon Schaible, LRT/CTRS        Deerica Waszak L 04/20/2016 11:59 AM

## 2016-04-20 NOTE — Progress Notes (Signed)
The focus of this group is to help patients review their daily goal of treatment and discuss progress on daily workbooks.  Patient did not attend group this evening.  

## 2016-04-21 MED ORDER — ARIPIPRAZOLE 5 MG PO TABS: 5.0000 mg | ORAL_TABLET | Freq: Every day | ORAL | Status: AC

## 2016-04-21 MED ORDER — PANTOPRAZOLE SODIUM 40 MG PO TBEC: 40.0000 mg | DELAYED_RELEASE_TABLET | Freq: Every day | ORAL | Status: AC

## 2016-04-21 MED ORDER — HYDROXYZINE HCL 50 MG PO TABS: 50.0000 mg | ORAL_TABLET | Freq: Four times a day (QID) | ORAL | Status: AC | PRN

## 2016-04-21 MED ORDER — CITALOPRAM HYDROBROMIDE 20 MG PO TABS: 20.0000 mg | ORAL_TABLET | Freq: Every day | ORAL | Status: AC

## 2016-04-21 MED ORDER — NICOTINE 21 MG/24HR TD PT24: 21.0000 mg | MEDICATED_PATCH | Freq: Every day | TRANSDERMAL | Status: AC

## 2016-04-21 MED ORDER — GABAPENTIN 400 MG PO CAPS: 400.0000 mg | ORAL_CAPSULE | Freq: Three times a day (TID) | ORAL | Status: AC

## 2016-04-21 MED ORDER — LISINOPRIL 20 MG PO TABS: 20.0000 mg | ORAL_TABLET | Freq: Every day | ORAL | Status: AC

## 2016-04-21 NOTE — Tx Team (Signed)
Interdisciplinary Treatment Plan Update (Adult) Date: 04/21/2016   Date: 04/21/2016 9:30am  Progress in Treatment:  Attending groups: Intermittently Participating in groups: Minimally Taking medication as prescribed: Yes  Tolerating medication: Yes  Family/Significant othe contact made: No, Pt declines Patient understands diagnosis: Yes AEB seeking help with depression Discussing patient identified problems/goals with staff: Yes  Medical problems stabilized or resolved: Yes  Denies suicidal/homicidal ideation: Yes Patient has not harmed self or Others: Yes   New problem(s) identified: None identified at this time.   Discharge Plan or Barriers:  Patient plans to return home with wife to resume outpatient services.   Additional comments:  Patient and CSW reviewed pt's identified goals and treatment plan. Patient verbalized understanding and agreed to treatment plan.   Reason for Continuation of Hospitalization:  Depression Medication stabilization Suicidal ideation Withdrawal symptoms  Estimated length of stay: Discharge anticipated for today 04/21/16  Review of initial/current patient goals per problem list:   1.  Goal(s): Patient will participate in aftercare plan  Met:  Yes  Target date: 3-5 days from date of admission   As evidenced by: Patient will participate within aftercare plan AEB aftercare provider and housing plan at discharge being identified.  04/18/16: CSW to work with Pt to assess for appropriate discharge plan and faciliate appointments and referrals as needed prior to d/c. 7/13: Goal met. Patient plans to return home with wife to follow up with outpatient services.  2.  Goal (s): Patient will exhibit decreased depressive symptoms and suicidal ideations.  Met:  Adequate for discharge per MD  Target date: 3-5 days from date of admission   As evidenced by: Patient will utilize self rating of depression at 3 or below and demonstrate decreased signs of  depression or be deemed stable for discharge by MD.  04/18/16: Pt is reporting high levels of depression, rating 9/10; denies SI  7/13: Adequate for discharge per MD. Patient reports improvement in symptoms, denies SI. He reports feeling safe for discharge today.  4.  Goal(s): Patient will demonstrate decreased signs of withdrawal due to substance abuse  Met:  Adequate for discharge per MD  Target date: 3-5 days from date of admission   As evidenced by: Patient will produce a CIWA/COWS score of 0, have stable vitals signs, and no symptoms of withdrawal  04/18/16: COWS score of 5 with sweating, joint aches, tremor, and anxiety/irritability.  7/13: Adequate for discharge per MD. Patient with COWS score of 5, experiencing aches, anxiety, and tremor. He reports feeling safe for discharge at this time.   Attendees:  Patient:    Family:    Physician: Dr. Parke Poisson, MD  04/21/2016   Nursing: Darrol Angel, RN; Mayra Neer, RN 04/21/2016   Clinical Social Worker Peri Maris, [place] 04/21/2016   Other: Erasmo Downer Ameya Kutz, LCSWA 04/21/2016   Clinical: Lars Pinks, RN Case manager  04/21/2016   Other: Agustina Caroli, Samuel Jester, NP 04/21/2016   Other:     Tilden Fossa, [place] Clinical Social Worker [facility] [phone]

## 2016-04-21 NOTE — Plan of Care (Signed)
Problem: Medication: Goal: Compliance with prescribed medication regimen will improve Outcome: Progressing Pt has been compliant with medications tonight.    

## 2016-04-21 NOTE — [facility] Group Notes (Signed)
[facility] LCSW Group Therapy 04/21/2016  1:15 PM   Type of Therapy: Group Therapy  Participation Level: Did Not Attend. Patient invited to participate but declined.   Maraki Macquarrie, MSW, LCSW Clinical Social Worker [facility] [phone]    

## 2016-04-21 NOTE — Progress Notes (Signed)
  [facility] :  Will you be returning to the same living situation after discharge:  Yes,  patient plans to return home with wife At discharge, do you have transportation home?: Yes,  patient's wife Do you have the ability to pay for your medications: Yes,  patient will be provided with prescriptions at discharge  Release of information consent forms completed and in the chart;  Patient's signature needed at discharge.  Patient to Follow up at: Follow-up Information    [facility]   Why:  8/9 at 9:30am for medication management with Dr. Mayford KnifeWilliams; 7/20 at 11:30am for group therapy with Dr. Manya SilvasWilliams   Contact information:   [address] [place], [place] [address] Phone: [phone] Fax:       Next level of care provider has [facility] to [facility] Link:no  Safety Planning and Suicide Prevention discussed: Yes,  with patient  Have you used any form of tobacco in the last 30 days? (Cigarettes, Smokeless Tobacco, Cigars, and/or Pipes): Yes  Has patient been referred to the Quitline?: Patient refused referral  Patient has been referred for addiction treatment: Yes  Antoine Fiallos, [place] CarboKristin L 04/21/2016, 3:47 PM

## 2016-04-21 NOTE — [facility] Suicide Risk Assessment (Signed)
[facility] Discharge Suicide Risk Assessment   Principal Problem: MDD (major depressive disorder), recurrent severe, without psychosis (HCC) Discharge Diagnoses:  Patient Active Problem List   Diagnosis Date Noted  . MDD (major depressive disorder), recurrent severe, without psychosis (HCC) [F33.2] 04/15/2016  . Hyponatremia [E87.1] 02/10/2015  . Pain in the chest [R07.9]   . Chest pain [R07.9] 02/09/2015  . HTN (hypertension) [I10] 02/09/2015  . History of narcotic addiction (HCC) [F11.21] 02/09/2015    Total Time spent with patient: 30 minutes  Musculoskeletal: Strength & Muscle Tone: within normal limits Gait & Station: normal Patient leans: N/A  Psychiatric Specialty Exam: ROS- denies chest pain,no shortness of breath, describes some bilateral leg muscle aches and restlessness, which he attributes to some residual opiate withdrawal symptoms  Blood pressure 123/86, pulse 92, temperature 98.7 F (37.1 C), temperature source Oral, resp. rate 20, height  (1.88 m), weight 212 lb (96.163 kg), SpO2 99 %.Body mass index is 27.21 kg/(m^2).  General Appearance: improving grooming   Eye Contact::  Good  Speech:  Normal Rate409  Volume:  Normal  Mood:  states his mood is partially improved compared to admission   Affect:  a little more reactive, smiles at times appropriately   Thought Process:  Linear  Orientation:  Full (Time, Place, and Person)  Thought Content:  denies hallucinations, no delusions   Suicidal Thoughts:  No- denies any suicidal or self injurious ideations, denies any homicidal or violent ideations   Homicidal Thoughts:  No- denies any homicidal or violent ideations   Memory:  recent and remote grossly intact   Judgement:  Other:  improving   Insight:  improving   Psychomotor Activity:  Normal  Concentration:  Good  Recall:  Good  Fund of Knowledge:Good  Language: Good  Akathisia:  NA  Handed:  Right  AIMS (if indicated):     Assets:  Desire for  Improvement Housing Social Support  Sleep:  Number of Hours: 5  Cognition: WNL  ADL's:  Intact   Mental Status Per Nursing Assessment::   On Admission:  Suicidal ideation indicated by patient  Demographic Factors:  50 year old married male   Loss Factors: Recent significant losses, lost job and at risk of losing home.   Historical Factors: History of depression , history of being on Suboxone management - history of opiate dependence. States he had been gradually tapering down Suboxone over recent weeks to months   Risk Reduction Factors:   Sense of responsibility to family, Living with another person, especially a relative and Positive coping skills or problem solving skills  Continued Clinical Symptoms:  At this time patient is alert, attentive, better groomed, states he is feeling better, less depressed, affect is less dysphoric/constricted and more reactive, smiles at times appropriately, no thought disorder, no SI or HI, no psychotic symptoms - no hallucinations, no delusions, future oriented, states he plans to restart Suboxone at low dose, via his outpatient pain MD, and taper off over a period of weeks to months in order to minimize any opiate WDL, which he states he is vulnerable to , describing muscle aches and cramps for weeks after tapering down or off opiates. States he plans to visit sister in [place] later this month .  Denies medication side effects.  Cognitive Features That Contribute To Risk:  No gross cognitive deficits noted upon discharge. Is alert , attentive, and oriented x 3    Suicide Risk:  Mild:  Suicidal ideation of limited frequency, intensity, duration,  and specificity.  There are no identifiable plans, no associated intent, mild dysphoria and related symptoms, good self-control (both objective and subjective assessment), few other risk factors, and identifiable protective factors, including available and accessible social support.    Plan Of  Care/Follow-up recommendations:  Activity:  as tolerated  Diet:  Regular Tests:  NA Other:  See below   Patient is requesting discharge and there are no current grounds for involuntary commitment  He is leaving in good spirits  Plans to return home . Plans to follow up with his MD , Dr. Emilio MathBarry Williams , and [facility] .   Nehemiah MassedOBOS, Wesley Sabino, MD 04/21/2016, 2:09 PM

## 2016-04-21 NOTE — Discharge Summary (Signed)
Physician Discharge Summary Note  Patient:  Wesley Giles Proffit is an 50 y.o., male MRN:  [id number] DOB:  May 10, 1966 Patient phone:  [phone] (home)  Patient address[place]   [address] [place] [id number],  Total Time spent with patient: 30 minutes  Date of Admission:  04/15/2016 Date of Discharge: 04/21/2016  Reason for Admission:  Worsening depression  Principal Problem: MDD (major depressive disorder), recurrent severe, without psychosis [facility](HCC) Discharge Diagnoses: Patient Active Problem List   Diagnosis Date Noted  . MDD (major depressive disorder), recurrent severe, without psychosis (HCC) [F33.2] 04/15/2016  . Hyponatremia [E87.1] 02/10/2015  . Pain in the chest [R07.9]   . Chest pain [R07.9] 02/09/2015  . HTN (hypertension) [I10] 02/09/2015  . History of narcotic addiction [facility](HCC) [F11.21] 02/09/2015    Past Psychiatric History:  See HPI  Past Medical History:  Past Medical History  Diagnosis Date  . History of narcotic addiction (HCC) 02/09/2015  . Depression   . Hypertension     Past Surgical History  Procedure Laterality Date  . Hernia repair    . Cholecystectomy    . Depression     Family History: History reviewed. No pertinent family history. Family Psychiatric  History:  See HPI Social History:  History  Alcohol Use No     History  Drug Use  . Yes  . Special: Marijuana    Comment: last use 6/31/17    Social History   Social History  . Marital Status: Married    Spouse Name: N/A  . Number of Children: N/A  . Years of Education: N/A   Social History Main Topics  . Smoking status: Current Every Day Smoker -- 1.00 packs/day    Types: Cigarettes  . Smokeless tobacco: None  . Alcohol Use: No  . Drug Use: Yes    Special: Marijuana     Comment: last use 6/31/17  . Sexual Activity: Not Asked   Other Topics Concern  . None   Social History Narrative    Hospital Course:   Wesley Giles Standard was admitted for MDD (major depressive disorder),  recurrent severe, without psychosis (HCC) and crisis management.  He was treated with meds and their indications listed below.  Medical problems were identified and treated as needed.  Home medications were restarted as appropriate.  Improvement was monitored by observation and Wesley Giles Letendre daily report of symptom reduction.  Emotional and mental status was monitored by daily self inventory reports completed by Wesley Giles Milian and clinical staff.  Patient reported continued improvement, denied any new concerns.  Patient had been compliant on medications and denied side effects.  Support and encouragement was provided.    At time of discharge, patient rated both depression and anxiety levels to be manageable and minimal.  Patient encouraged to attend groups to help with recognizing triggers of emotional crises and de-stabilizations.  Patient encouraged to attend group to help identify the positive things in life that would help in dealing with feelings of loss, depression and unhealthy or abusive tendencies.         Wesley Giles Commins was evaluated by the treatment team for stability and plans for continued recovery upon discharge.  He was offered further treatment options upon discharge including Residential, Intensive Outpatient and Outpatient treatment. He will follow up with agency listed below for medication management and counseling.  Encouraged patient to maintain satisfactory support network and home environment.  Advised to adhere to medication compliance and outpatient treatment follow up.  Prescriptions provided.       Wesley Marion motivation was an integral factor for scheduling further treatment.  Employment, transportation, bed availability, health status, family support, and any pending legal issues were also considered during his hospital stay.  Upon completion of this admission the patient was both mentally and medically stable for discharge denying suicidal/homicidal ideation,  auditory/visual/tactile hallucinations, delusional thoughts and paranoia.      Physical Findings: AIMS: Facial and Oral Movements Muscles of Facial Expression: None, normal Lips and Perioral Area: None, normal Jaw: None, normal Tongue: None, normal,Extremity Movements Upper (arms, wrists, hands, fingers): None, normal Lower (legs, knees, ankles, toes): None, normal, Trunk Movements Neck, shoulders, hips: None, normal, Overall Severity Severity of abnormal movements (highest score from questions above): None, normal Incapacitation due to abnormal movements: None, normal Patient's awareness of abnormal movements (rate only patient's report): No Awareness, Dental Status Current problems with teeth and/or dentures?: No Does patient usually wear dentures?: No  CIWA:  CIWA-Ar Total: 3 COWS:  COWS Total Score: 5  Musculoskeletal: Strength & Muscle Tone: within normal limits Gait & Station: normal Patient leans: N/A  Psychiatric Specialty Exam:  SEE MD SRA Physical Exam  Vitals reviewed. Neck: Thyromegaly present.    Review of Systems  All other systems reviewed and are negative.   Blood pressure 123/86, pulse 92, temperature 98.7 F (37.1 C), temperature source Oral, resp. rate 20, height 6\' 2"  (1.88 m), weight 96.163 kg (212 lb), SpO2 99 %.Body mass index is 27.21 kg/(m^2).    Have you used any form of tobacco in the last 30 days? (Cigarettes, Smokeless Tobacco, Cigars, and/or Pipes): Yes  Has this patient used any form of tobacco in the last 30 days? (Cigarettes, Smokeless Tobacco, Cigars, and/or Pipes) Yes, yes, given to pt  Blood Alcohol level:  Lab Results  Component Value Date   ETH <5 04/15/2016    Metabolic Disorder Labs:  No results found for: HGBA1C, MPG No results found for: PROLACTIN No results found for: CHOL, TRIG, HDL, CHOLHDL, VLDL, LDLCALC  See Psychiatric Specialty Exam and Suicide Risk Assessment completed by Attending Physician prior to  discharge.  Discharge destination:  Home  Is patient on multiple antipsychotic therapies at discharge:  No   Has Patient had three or more failed trials of antipsychotic monotherapy by history:  No  Recommended Plan for Multiple Antipsychotic Therapies: NA     Medication List    STOP taking these medications        amLODipine 10 MG tablet  Commonly known as:  NORVASC     amphetamine-dextroamphetamine 30 MG tablet  Commonly known as:  ADDERALL     aspirin-acetaminophen-caffeine 250-250-65 MG tablet  Commonly known as:  EXCEDRIN MIGRAINE     buprenorphine-naloxone 2-0.5 MG Subl SL tablet  Commonly known as:  SUBOXONE     esomeprazole 40 MG capsule  Commonly known as:  NEXIUM     FLUoxetine 40 MG capsule  Commonly known as:  PROZAC     OLANZapine 10 MG tablet  Commonly known as:  ZYPREXA      TAKE these medications      Indication   ARIPiprazole 5 MG tablet  Commonly known as:  ABILIFY  Take 1 tablet (5 mg total) by mouth daily.      citalopram 20 MG tablet  Commonly known as:  CELEXA  Take 1 tablet (20 mg total) by mouth daily.   Indication:  Depression     gabapentin 400 MG capsule  Commonly known as:  NEURONTIN  Take 1 capsule (400 mg total) by mouth 3 (three) times daily.   Indication:  Neuropathic Pain     hydrOXYzine 50 MG tablet  Commonly known as:  ATARAX/VISTARIL  Take 1 tablet (50 mg total) by mouth every 6 (six) hours as needed for anxiety.   Indication:  Anxiety Neurosis     lisinopril 20 MG tablet  Commonly known as:  PRINIVIL,ZESTRIL  Take 1 tablet (20 mg total) by mouth daily.   Indication:  High Blood Pressure     nicotine 21 mg/24hr patch  Commonly known as:  NICODERM CQ - dosed in mg/24 hours  Place 1 patch (21 mg total) onto the skin daily.   Indication:  Nicotine Addiction     pantoprazole 40 MG tablet  Commonly known as:  PROTONIX  Take 1 tablet (40 mg total) by mouth daily.   Indication:  Gastroesophageal Reflux Disease        Follow-up recommendations:  Activity:  as tol Diet:  as tol  Comments:  1.  Take all your medications as prescribed.   2.  Report any adverse side effects to outpatient provider. 3.  Patient instructed to not use alcohol or illegal drugs while on prescription medicines. 4.  In the event of worsening symptoms, instructed patient to call 911, the crisis hotline or go to nearest emergency room for evaluation of symptoms.  Signed: Lindwood Qua, NP [facility] 04/21/2016, 12:13 PM  Patient seen, Suicide Assessment Completed.  Disposition Plan Reviewed

## 2016-04-21 NOTE — Progress Notes (Signed)
Adult Psychoeducational Group Note  Date:  04/21/2016 Time:  0845 am  Group Topic/Focus:  Orientation:   The focus of this group is to educate the patient on the purpose and policies of crisis stabilization and provide a format to answer questions about their admission.  The group details unit policies and expectations of patients while admitted.  Participation Level:  Active  Participation Quality:  Appropriate  Affect:  Appropriate  Cognitive:  Appropriate  Insight: Appropriate  Engagement in Group:  Engaged  Modes of Intervention:  Education and Orientation  Additional Comments:    Ason Heslin L 04/21/2016, 2:27 PM

## 2016-04-21 NOTE — Progress Notes (Signed)
Discharge note: Pt received both written and verbal discharge instructions. Pt verbalized understanding of discharge instructions. Pt agreed to f/u appt and med regimen. Pt received prescriptions, SRA, AVS, and transition record. Pt received belongings from room and locker. Pt safely discharged to lobby.
